# Patient Record
Sex: Female | Born: 1950 | Race: White | Hispanic: No | Marital: Married | Smoking: Never smoker
Health system: Southern US, Community
[De-identification: ages and names within clinical notes are randomized; demographics above are authoritative.]

## PROBLEM LIST (undated history)

## (undated) ENCOUNTER — Emergency Department (HOSPITAL_COMMUNITY): Discharge status: Absent without leave | Payer: Self-pay

## (undated) DIAGNOSIS — E669 Obesity, unspecified: Secondary | ICD-10-CM

## (undated) DIAGNOSIS — I10 Essential (primary) hypertension: Secondary | ICD-10-CM

## (undated) DIAGNOSIS — K5792 Diverticulitis of intestine, part unspecified, without perforation or abscess without bleeding: Secondary | ICD-10-CM

## (undated) DIAGNOSIS — K219 Gastro-esophageal reflux disease without esophagitis: Secondary | ICD-10-CM

## (undated) DIAGNOSIS — M791 Myalgia, unspecified site: Secondary | ICD-10-CM

## (undated) DIAGNOSIS — F419 Anxiety disorder, unspecified: Secondary | ICD-10-CM

## (undated) DIAGNOSIS — E785 Hyperlipidemia, unspecified: Secondary | ICD-10-CM

## (undated) DIAGNOSIS — N889 Noninflammatory disorder of cervix uteri, unspecified: Secondary | ICD-10-CM

## (undated) DIAGNOSIS — M8580 Other specified disorders of bone density and structure, unspecified site: Secondary | ICD-10-CM

## (undated) DIAGNOSIS — R102 Pelvic and perineal pain: Secondary | ICD-10-CM

## (undated) DIAGNOSIS — I4891 Unspecified atrial fibrillation: Secondary | ICD-10-CM

## (undated) DIAGNOSIS — L570 Actinic keratosis: Secondary | ICD-10-CM

## (undated) HISTORY — DX: Gastro-esophageal reflux disease without esophagitis: K21.9

## (undated) HISTORY — DX: Diverticulitis of intestine, part unspecified, without perforation or abscess without bleeding: K57.92

## (undated) HISTORY — DX: Actinic keratosis: L57.0

## (undated) HISTORY — DX: Essential (primary) hypertension: I10

## (undated) HISTORY — DX: Noninflammatory disorder of cervix uteri, unspecified: N88.9

## (undated) HISTORY — PX: COLON SURGERY: SHX602

## (undated) HISTORY — DX: Pelvic and perineal pain: R10.2

---

## 2004-10-06 ENCOUNTER — Ambulatory Visit: Payer: Self-pay

## 2005-11-30 ENCOUNTER — Ambulatory Visit: Payer: Self-pay

## 2007-02-06 ENCOUNTER — Ambulatory Visit: Payer: Self-pay

## 2007-02-15 ENCOUNTER — Ambulatory Visit: Payer: Self-pay

## 2007-12-25 ENCOUNTER — Other Ambulatory Visit: Payer: Self-pay

## 2007-12-25 ENCOUNTER — Emergency Department: Payer: Self-pay | Admitting: Emergency Medicine

## 2008-02-19 ENCOUNTER — Ambulatory Visit: Payer: Self-pay

## 2009-03-11 ENCOUNTER — Ambulatory Visit: Payer: Self-pay | Admitting: Family Medicine

## 2009-07-20 ENCOUNTER — Inpatient Hospital Stay: Payer: Self-pay | Admitting: Surgery

## 2009-09-04 ENCOUNTER — Inpatient Hospital Stay: Payer: Self-pay | Admitting: Surgery

## 2009-09-16 HISTORY — PX: LAPAROSCOPIC SIGMOID COLECTOMY: SHX5928

## 2009-09-23 ENCOUNTER — Ambulatory Visit: Payer: Self-pay | Admitting: Surgery

## 2009-09-30 ENCOUNTER — Inpatient Hospital Stay: Payer: Self-pay | Admitting: Surgery

## 2010-03-24 ENCOUNTER — Ambulatory Visit: Payer: Self-pay | Admitting: Family Medicine

## 2011-04-21 ENCOUNTER — Ambulatory Visit: Payer: Self-pay | Admitting: Family Medicine

## 2011-10-31 ENCOUNTER — Emergency Department: Payer: Self-pay | Admitting: *Deleted

## 2012-04-24 ENCOUNTER — Ambulatory Visit: Payer: Self-pay | Admitting: Family Medicine

## 2013-04-25 ENCOUNTER — Ambulatory Visit: Payer: Self-pay | Admitting: Family Medicine

## 2014-06-04 ENCOUNTER — Ambulatory Visit: Payer: Self-pay | Admitting: Family Medicine

## 2015-07-10 ENCOUNTER — Other Ambulatory Visit: Payer: Self-pay | Admitting: Family Medicine

## 2015-07-10 DIAGNOSIS — Z139 Encounter for screening, unspecified: Secondary | ICD-10-CM

## 2015-08-11 ENCOUNTER — Ambulatory Visit
Admission: RE | Admit: 2015-08-11 | Discharge: 2015-08-11 | Disposition: A | Discharge status: Home / Self Care | Payer: BC Managed Care – PPO | Source: Ambulatory Visit | Attending: Family Medicine | Admitting: Family Medicine

## 2015-08-11 DIAGNOSIS — Z1231 Encounter for screening mammogram for malignant neoplasm of breast: Secondary | ICD-10-CM | POA: Diagnosis present

## 2015-08-11 DIAGNOSIS — Z139 Encounter for screening, unspecified: Secondary | ICD-10-CM

## 2015-09-12 DIAGNOSIS — N889 Noninflammatory disorder of cervix uteri, unspecified: Secondary | ICD-10-CM | POA: Insufficient documentation

## 2015-09-12 HISTORY — DX: Noninflammatory disorder of cervix uteri, unspecified: N88.9

## 2016-02-14 ENCOUNTER — Emergency Department
Admission: EM | Admit: 2016-02-14 | Discharge: 2016-02-14 | Disposition: A | Discharge status: Home / Self Care | Payer: BC Managed Care – PPO | Attending: Emergency Medicine | Admitting: Emergency Medicine

## 2016-02-14 ENCOUNTER — Encounter: Payer: Self-pay | Admitting: Emergency Medicine

## 2016-02-14 DIAGNOSIS — I10 Essential (primary) hypertension: Secondary | ICD-10-CM | POA: Diagnosis not present

## 2016-02-14 DIAGNOSIS — T887XXA Unspecified adverse effect of drug or medicament, initial encounter: Secondary | ICD-10-CM | POA: Insufficient documentation

## 2016-02-14 DIAGNOSIS — Y829 Unspecified medical devices associated with adverse incidents: Secondary | ICD-10-CM | POA: Insufficient documentation

## 2016-02-14 DIAGNOSIS — T7840XA Allergy, unspecified, initial encounter: Secondary | ICD-10-CM | POA: Diagnosis present

## 2016-02-14 DIAGNOSIS — T464X5A Adverse effect of angiotensin-converting-enzyme inhibitors, initial encounter: Secondary | ICD-10-CM | POA: Diagnosis not present

## 2016-02-14 HISTORY — DX: Gastro-esophageal reflux disease without esophagitis: K21.9

## 2016-02-14 HISTORY — DX: Essential (primary) hypertension: I10

## 2016-02-14 MED ORDER — METHYLPREDNISOLONE SODIUM SUCC 125 MG IJ SOLR
125.0000 mg | Freq: Once | INTRAMUSCULAR | Status: AC
  Administered 2016-02-14: 125 mg via INTRAMUSCULAR
  Filled 2016-02-14: qty 2

## 2016-02-14 MED ORDER — PREDNISONE 10 MG PO TABS: 10.0000 mg | ORAL_TABLET | ORAL | 0 refills | Status: DC

## 2016-02-14 NOTE — ED Notes (Signed)
Patient states intermit numbness around mouth. Denies neuro symptoms. Family states notable increasing in swelling around mouth. Airway clear, NAD noted

## 2016-02-14 NOTE — ED Triage Notes (Signed)
States was seen for possible allergic rxt while out of town. Was seen and given some shots but no rx. Was to take benadryl otc. States still taking her enalapril b/c the ed dr wasn't sure if it was a rxt to the medication. States her face feels numb on and off when she takes the medication. No swelling noted.

## 2016-02-14 NOTE — ED Provider Notes (Signed)
[facility] Emergency Department Provider Note  ____________________________________________  Time seen: Approximately 3:00 PM  I have reviewed the triage vital signs and the nursing notes.   HISTORY  Chief Complaint Numbness (feels numb but can feel touch) and Allergic Reaction    HPI Rebecca Burton is a 65 y.o. female who presents emergency department complaining of cheek, lip, tongue swelling. Patient states that she had a "allergic reaction" and was seen at an urgent care and was given shot of steroid and Benadryl. She reports that provider was concerned that this may be in part due to her ACE inhibitor. Patient states that she stopped her blood pressure medication for 2 days. She began to experience a headache and restarted her enalapril. Patient reports that today she had some upper and lower lip swelling, feeling like her tongue was patent headache, and cheek swelling. Patient denies any shortness of breath, wheezing, difficulty breathing. Patient states that symptoms have gone down after taking Benadryl. No other complaint of headache, fevers or chills, difficulty breathing or swallowing, chest pain, abdominal pain, nausea or vomiting.   Past Medical History:  Diagnosis Date  . Acid reflux   . Hypertension     There are no active problems to display for this patient.   Past Surgical History:  Procedure Laterality Date  . COLON SURGERY      Prior to Admission medications   Medication Sig Start Date End Date Taking? Authorizing Provider  enalapril (VASOTEC) 10 MG tablet Take 10 mg by mouth daily.   Yes Historical Provider, MD  hydrochlorothiazide (HYDRODIURIL) 25 MG tablet Take 25 mg by mouth daily.   Yes Historical Provider, MD  predniSONE (DELTASONE) 10 MG tablet Take 1 tablet (10 mg total) by mouth as directed. 02/14/16   Charline Bills Eulogio Requena, PA-C    Allergies Review of patient's allergies indicates no known allergies.  Family History   Problem Relation Age of Onset  . Breast cancer Maternal Grandmother     Social History Social History  Substance Use Topics  . Smoking status: Never Smoker  . Smokeless tobacco: Never Used  . Alcohol use No     Review of Systems  Constitutional: No fever/chills Eyes: No visual changes. ENT: Positive for lip swelling. Positive for "thick tongue. Cardiovascular: no chest pain. Respiratory: no cough. No SOB. Gastrointestinal: No abdominal pain.  No nausea, no vomiting.   Musculoskeletal: Negative for musculoskeletal pain. Skin: Negative for rash, abrasions, lacerations, ecchymosis. Neurological: Negative for headaches, focal weakness or numbness. 10-point ROS otherwise negative.  ____________________________________________   PHYSICAL EXAM:  VITAL SIGNS: ED Triage Vitals [02/14/16 1330]  Enc Vitals Group     BP (!) 161/83     Pulse Rate 93     Resp 18     Temp 98.2 F (36.8 C)     Temp src      SpO2 96 %     Weight 235 lb (106.6 kg)     Height 5\' 5"  (1.651 m)     Head Circumference      Peak Flow      Pain Score      Pain Loc      Pain Edu?      Excl. in [place]?      Constitutional: Alert and oriented. Well appearing and in no acute distress. Eyes: Conjunctivae are normal. PERRL. EOMI. Head: Atraumatic. ENT:      Ears:       Nose: No congestion/rhinnorhea.  Mouth/Throat: Mucous membranes are moist. Lips are not edematous or erythematous. No oropharyngeal edema is identified. Uvula is midline. Neck: No stridor.    Cardiovascular: Normal rate, regular rhythm. Normal S1 and S2.  Good peripheral circulation. Respiratory: Normal respiratory effort without tachypnea or retractions. Lungs CTAB. Good air entry to the bases with no decreased or absent breath sounds. Musculoskeletal: Full range of motion to all extremities. No gross deformities appreciated. Neurologic:  Normal speech and language. No gross focal neurologic deficits are appreciated.  Skin:  Skin is  warm, dry and intact. No rash noted. Psychiatric: Mood and affect are normal. Speech and behavior are normal. Patient exhibits appropriate insight and judgement.   ____________________________________________   LABS (all labs ordered are listed, but only abnormal results are displayed)  Labs Reviewed - No data to display ____________________________________________  EKG   ____________________________________________  RADIOLOGY   No results found.  ____________________________________________    PROCEDURES  Procedure(s) performed:    Procedures    Medications  methylPREDNISolone sodium succinate (SOLU-MEDROL) 125 mg/2 mL injection 125 mg (not administered)     ____________________________________________   INITIAL IMPRESSION / ASSESSMENT AND PLAN / ED COURSE  Pertinent labs & imaging results that were available during my care of the patient were reviewed by me and considered in my medical decision making (see chart for details).  Clinical Course    Patient's diagnosis is consistent with Adverse reaction to ACE inhibitor. Patient reports swelling that has improved after taking Benadryl at home. Exam is reassuring with no oral or oropharyngeal edema. No stridor. No respiratory distress. Patient speaking in full sentences. She is given injection of steroid here in the emergency department and will be discharged home with oral steroids and instructions to continue Benadryl. Patient is advised not to restart her ACE inhibitor at this time. Patient is to take double her normal dose of HCTZ for blood pressure control until she can talk with her primary care provider on Monday morning. Patient is given strict ED precautions to return to the emergency department for any worsening of her symptoms..     ____________________________________________  FINAL CLINICAL IMPRESSION(S) / ED DIAGNOSES  Final diagnoses:  Adverse reaction to ACE inhibitor drug, initial encounter       NEW MEDICATIONS STARTED DURING THIS VISIT:  New Prescriptions   PREDNISONE (DELTASONE) 10 MG TABLET    Take 1 tablet (10 mg total) by mouth as directed.        This chart was dictated using voice recognition software/Dragon. Despite best efforts to proofread, errors can occur which can change the meaning. Any change was purely unintentional.    Darletta Moll, PA-C 02/14/16 1609    Lisa Roca, MD 02/17/16 [phone]

## 2016-02-14 NOTE — ED Notes (Signed)

## 2016-07-28 ENCOUNTER — Other Ambulatory Visit: Payer: Self-pay | Admitting: Family Medicine

## 2016-07-28 DIAGNOSIS — Z1231 Encounter for screening mammogram for malignant neoplasm of breast: Secondary | ICD-10-CM

## 2016-09-06 ENCOUNTER — Ambulatory Visit
Admission: RE | Admit: 2016-09-06 | Discharge: 2016-09-06 | Disposition: A | Discharge status: Home / Self Care | Payer: Medicare Other | Source: Ambulatory Visit | Attending: Family Medicine | Admitting: Family Medicine

## 2016-09-06 DIAGNOSIS — Z1231 Encounter for screening mammogram for malignant neoplasm of breast: Secondary | ICD-10-CM | POA: Diagnosis present

## 2016-11-01 ENCOUNTER — Other Ambulatory Visit: Payer: Self-pay

## 2016-11-01 DIAGNOSIS — K5792 Diverticulitis of intestine, part unspecified, without perforation or abscess without bleeding: Secondary | ICD-10-CM

## 2016-11-01 DIAGNOSIS — I10 Essential (primary) hypertension: Secondary | ICD-10-CM

## 2016-11-01 DIAGNOSIS — R102 Pelvic and perineal pain: Secondary | ICD-10-CM

## 2016-11-01 DIAGNOSIS — E669 Obesity, unspecified: Secondary | ICD-10-CM | POA: Insufficient documentation

## 2016-11-01 DIAGNOSIS — K219 Gastro-esophageal reflux disease without esophagitis: Secondary | ICD-10-CM | POA: Insufficient documentation

## 2016-11-01 HISTORY — DX: Diverticulitis of intestine, part unspecified, without perforation or abscess without bleeding: K57.92

## 2016-11-01 HISTORY — DX: Gastro-esophageal reflux disease without esophagitis: K21.9

## 2016-11-01 HISTORY — DX: Essential (primary) hypertension: I10

## 2016-11-01 HISTORY — DX: Pelvic and perineal pain: R10.2

## 2016-11-02 ENCOUNTER — Ambulatory Visit: Payer: Medicare Other | Admitting: Surgery

## 2016-11-08 ENCOUNTER — Ambulatory Visit (INDEPENDENT_AMBULATORY_CARE_PROVIDER_SITE_OTHER): Payer: Medicare Other | Admitting: General Surgery

## 2016-11-08 ENCOUNTER — Encounter: Payer: Self-pay | Admitting: General Surgery

## 2016-11-08 VITALS — BP 179/84 | HR 90 | Temp 98.0°F | Ht 65.0 in | Wt 233.0 lb

## 2016-11-08 DIAGNOSIS — R1010 Upper abdominal pain, unspecified: Secondary | ICD-10-CM | POA: Diagnosis not present

## 2016-11-08 NOTE — Progress Notes (Signed)
Patient ID: Rebecca Burton, female   DOB: 10/12/1950, 66 y.o.   MRN: [id number]  CC: ABDOMINAL PAIN  HPI Rebecca Burton is a 66 y.o. female who presents to clinic today for evaluation of abdominal pain. Patient reports a history of intermittent pains primarily in the left and right flank. She thinks they're associated with eating. The pain starts in her flanks and then will go upper mid back to her bilateral shoulder blades. She was evaluated by cardiology which then turned evaluated her gallbladder where she was found to have gallstones. Patient reports a history of 2 times in the last 6 weeks where she had pain after eating that this lasted for 24-36 hours. She describes the pain is sometimes being intense, stabbing. She also has occasional midepigastric pain which she associates with reflux. She is on reflux medication for this. She denies any fevers, chills, chest pain, shortness of breath. She's had loose stools that she associates with her antireflux medication. She has had some subjective nausea but no vomiting when she has the pain attacks.  HPI  Past Medical History:  Diagnosis Date  . Acid reflux   . Cervical lesion 09/12/2015  . Diverticulitis 11/01/2016  . GERD (gastroesophageal reflux disease) 11/01/2016  . HTN (hypertension) 11/01/2016  . Hypertension   . Pelvic pain in female 11/01/2016    Past Surgical History:  Procedure Laterality Date  . COLON SURGERY    . LAPAROSCOPIC SIGMOID COLECTOMY  09/2009    Family History  Problem Relation Age of Onset  . Breast cancer Maternal Grandmother   . Heart disease Mother   . Hypertension Mother   . Thyroid cancer Mother     Social History Social History  Substance Use Topics  . Smoking status: Never Smoker  . Smokeless tobacco: Never Used  . Alcohol use No    Allergies  Allergen Reactions  . Enalapril Swelling    Critical  . Macrolides And Ketolides Nausea And Vomiting    Yeast infection  . Naproxen Sodium Nausea And  Vomiting    Pt does not think she has a problem with this medication    Current Outpatient Prescriptions  Medication Sig Dispense Refill  . Calcium Carbonate-Vitamin D 600-400 MG-UNIT tablet Take 1 tablet by mouth 2 (two) times daily.    . fexofenadine (ALLEGRA) 180 MG tablet Take 1 tablet by mouth 1 day or 1 dose.    . fluticasone (FLONASE) 50 MCG/ACT nasal spray Place 2 sprays into the nose 1 day or 1 dose.    . hydrochlorothiazide (HYDRODIURIL) 25 MG tablet Take 25 mg by mouth daily.    Marland Kitchen ibuprofen (ADVIL,MOTRIN) 800 MG tablet Take 1 tablet by mouth every 8 (eight) hours as needed.    Marland Kitchen losartan (COZAAR) 100 MG tablet Take 1 tablet by mouth 1 day or 1 dose.    . Omega-3 Fatty Acids (PA FISH OIL) 1000 MG CAPS Take 1 tablet by mouth 1 day or 1 dose.    Marland Kitchen omeprazole (PRILOSEC) 40 MG capsule Take 1 capsule by mouth 1 day or 1 dose.     No current facility-administered medications for this visit.      Review of Systems A Multi-point review of systems was asked and was negative except for the findings documented in the history of present illness  Physical Exam Blood pressure (!) 179/84, pulse 90, temperature 98 F (36.7 C), temperature source Oral, height 5\' 5"  (1.651 m), weight 105.7 kg (233 lb). CONSTITUTIONAL: No acute distress.  EYES: Pupils are equal, round, and reactive to light, Sclera are non-icteric. EARS, NOSE, MOUTH AND THROAT: The oropharynx is clear. The oral mucosa is pink and moist. Hearing is intact to voice. LYMPH NODES:  Lymph nodes in the neck are normal. RESPIRATORY:  Lungs are clear. There is normal respiratory effort, with equal breath sounds bilaterally, and without pathologic use of accessory muscles. CARDIOVASCULAR: Heart is regular without murmurs, gallops, or rubs. GI: The abdomen is large, soft, nontender, and nondistended. There are no palpable masses. Well-healed midline incision from prior abdominal surgery. There is no hepatosplenomegaly. There are normal  bowel sounds in all quadrants. GU: Rectal deferred.   MUSCULOSKELETAL: Normal muscle strength and tone. No cyanosis or edema.   SKIN: Turgor is good and there are no pathologic skin lesions or ulcers. NEUROLOGIC: Motor and sensation is grossly normal. Cranial nerves are grossly intact. PSYCH:  Oriented to person, place and time. Affect is normal.  Data Reviewed Ultrasound report reviewed from care of your. It shows a distended gallbladder with evidence of gallstones and sludge. No evidence of cholecystitis. I have personally reviewed the patient's imaging, laboratory findings and medical records.    Assessment    Atypical abdominal pain    Plan    66 year old female with atypical abdominal pain here today for evaluation of cholelithiasis. After long conversation patient voiced that she really prefer not to have surgery. Her story and symptoms are not entirely consistent with cholelithiasis being the cause. Given this I could not guarantee to her that removing her gallbladder would relieve her symptoms. In this setting discussed possible referral to gastroenterology for further workup to include possible endoscopies. She voiced understanding and is willing to accept this plan. Discussed that if gastrology was unable to find any other possible causes that I will again offer her cholecystectomy. She voiced understanding and will follow-up in clinic in 5 weeks after being seen by GI.     Time spent with the patient was 45 minutes, with more than 50% of the time spent in face-to-face education, counseling and care coordination.     Clayburn Pert, MD FACS General Surgeon 11/08/2016, 3:30 PM

## 2016-11-08 NOTE — Patient Instructions (Signed)
WE would like for you to see the Gastroenterologist. We will send the referral and someone from their office will call and make an appointment. Please call our office if you have any questions. Please see your appointment listed below.

## 2016-11-09 ENCOUNTER — Telehealth: Payer: Self-pay

## 2016-11-09 NOTE — Telephone Encounter (Signed)
I have put in an internal referral to [facility]    I will follow up within 3-5 days to make sure the appointments have been scheduled.

## 2016-11-16 NOTE — Telephone Encounter (Signed)
An appointment has been scheduled with Dr. Allen Norris for 11/22/16

## 2016-11-17 ENCOUNTER — Telehealth: Payer: Self-pay

## 2016-11-17 NOTE — Telephone Encounter (Signed)
Patient notified of appointment with DR.Wohl on 11/22/16 @ 1:15 pm.  Also mailed reminedr appointment for DR.Woodham 12/03/16 @ 9:30 am.  Patient verbalized understanding.

## 2016-11-22 ENCOUNTER — Encounter: Payer: Self-pay | Admitting: Gastroenterology

## 2016-11-22 ENCOUNTER — Other Ambulatory Visit: Payer: Self-pay

## 2016-11-22 ENCOUNTER — Ambulatory Visit (INDEPENDENT_AMBULATORY_CARE_PROVIDER_SITE_OTHER): Payer: Medicare Other | Admitting: Gastroenterology

## 2016-11-22 VITALS — BP 155/80 | HR 70 | Temp 99.0°F | Ht 65.0 in | Wt 232.5 lb

## 2016-11-22 DIAGNOSIS — R1013 Epigastric pain: Secondary | ICD-10-CM

## 2016-11-22 DIAGNOSIS — R112 Nausea with vomiting, unspecified: Secondary | ICD-10-CM

## 2016-11-22 DIAGNOSIS — G8929 Other chronic pain: Secondary | ICD-10-CM | POA: Diagnosis not present

## 2016-11-22 NOTE — Progress Notes (Signed)
Gastroenterology Consultation  Referring Provider:     Clayburn Pert, MD Primary Care Physician:  Patient, No Pcp Per Primary Gastroenterologist:  Dr. Allen Norris     Reason for Consultation:     Epigastric pain        HPI:   Rebecca Burton is a 66 y.o. y/o female referred for consultation & management of Epigastric pain by Dr. Patient, No Pcp Per.  This patient comes in today with a history of epigastric pain that wrapped around both sides and goes into her back. The patient states that sometimes it is associated with certain foods and other times the same food still cause her to have any problems. The patient reports that she has not had any unexplained weight loss and she feels that these episodes are getting more frequent. The patient had an episode around [place] time and then had one 3 weeks ago and then one last week. There is no report of any black stools or bloody stools she also denies any change in bowel habits. The patient did have a colonoscopy 10 years ago and she reports one year after that she had a pinhole perforation in her colon which needed surgery. She is convinced that the colonoscopy 1 year prior may have caused her to have the whole in her colon. The patient was reporting to me that she was told that she would need a repeat colonoscopy in 5-7 years from the last one. She is not sure if she had any polyps at that time but denies any family history of colon cancer colon polyps. Patient reports that she has heartburn with burning in her throat and chest. She is on omeprazole and states that she still has the heartburn. She also states that she had an upper endoscopy approximate years ago at the time of colonoscopy and was told that there was scarring in her esophagus.  Past Medical History:  Diagnosis Date  . Acid reflux   . Cervical lesion 09/12/2015  . Diverticulitis 11/01/2016  . GERD (gastroesophageal reflux disease) 11/01/2016  . HTN (hypertension) 11/01/2016  . Hypertension     . Pelvic pain in female 11/01/2016    Past Surgical History:  Procedure Laterality Date  . COLON SURGERY    . LAPAROSCOPIC SIGMOID COLECTOMY  09/2009    Prior to Admission medications   Medication Sig Start Date End Date Taking? Authorizing Provider  Calcium Carbonate-Vitamin D 600-400 MG-UNIT tablet Take 1 tablet by mouth 2 (two) times daily.   Yes [provider]  fexofenadine (ALLEGRA) 180 MG tablet Take 1 tablet by mouth 1 day or 1 dose.   Yes [provider]  fluticasone (FLONASE) 50 MCG/ACT nasal spray Place 2 sprays into the nose 1 day or 1 dose.   Yes [provider]  hydrochlorothiazide (HYDRODIURIL) 25 MG tablet Take 25 mg by mouth daily.   Yes [provider]  ibuprofen (ADVIL,MOTRIN) 800 MG tablet Take 1 tablet by mouth every 8 (eight) hours as needed.   Yes [provider]  losartan (COZAAR) 100 MG tablet Take 1 tablet by mouth 1 day or 1 dose. 02/16/16  Yes [provider]  Omega-3 Fatty Acids (PA FISH OIL) 1000 MG CAPS Take 1 tablet by mouth 1 day or 1 dose.   Yes [provider]  omeprazole (PRILOSEC) 40 MG capsule Take 1 capsule by mouth 1 day or 1 dose. 04/12/16  Yes [provider]    Family History  Problem Relation Age  of Onset  . Breast cancer Maternal Grandmother   . Heart disease Mother   . Hypertension Mother   . Thyroid cancer Mother      Social History  Substance Use Topics  . Smoking status: Never Smoker  . Smokeless tobacco: Never Used  . Alcohol use No    Allergies as of 11/22/2016 - Review Complete 11/22/2016  Allergen Reaction Noted  . Enalapril Swelling 03/05/2016  . Macrolides and ketolides Nausea And Vomiting 09/12/2015  . Naproxen sodium Nausea And Vomiting 09/12/2015    Review of Systems:    All systems reviewed and negative except where noted in HPI.   Physical Exam:  BP (!) 155/80   Pulse 70   Temp 99 F (37.2 C) (Oral)   Ht 5\' 5"  (1.651 m)   Wt 232 lb 8 oz  (105.5 kg)   BMI 38.69 kg/m  No LMP recorded. Patient is postmenopausal. Psych:  Alert and cooperative. Normal mood and affect. General:   Alert,  Well-developed, well-nourished, pleasant and cooperative in NAD Head:  Normocephalic and atraumatic. Eyes:  Sclera clear, no icterus.   Conjunctiva pink. Ears:  Normal auditory acuity. Nose:  No deformity, discharge, or lesions. Mouth:  No deformity or lesions,oropharynx pink & moist. Neck:  Supple; no masses or thyromegaly. Lungs:  Respirations even and unlabored.  Clear throughout to auscultation.   No wheezes, crackles, or rhonchi. No acute distress. Heart:  Regular rate and rhythm; no murmurs, clicks, rubs, or gallops. Abdomen:  Normal bowel sounds.  No bruits.  Soft, non-tender and non-distended without masses, hepatosplenomegaly or hernias noted.  No guarding or rebound tenderness.  Negative Carnett sign.   Rectal:  Deferred.  Msk:  Symmetrical without gross deformities.  Good, equal movement & strength bilaterally. Pulses:  Normal pulses noted. Extremities:  No clubbing or edema.  No cyanosis. Neurologic:  Alert and oriented x3;  grossly normal neurologically. Skin:  Intact without significant lesions or rashes.  No jaundice. Lymph Nodes:  No significant cervical adenopathy. Psych:  Alert and cooperative. Normal mood and affect.  Imaging Studies: No results found.  Assessment and Plan:   Rebecca Burton is a 66 y.o. y/o female who comes in today with abdominal pain is not reproducible on physical exam today. The patient will be started on a trial of Dexilant to see if this helps her symptoms better than her omeprazole. The patient has also been told that she should have her upper endoscopy to look for source of her abdominal pain that is in the epigastric area and wraps around bilaterally to her back. The patient has been seen by surgery for her gallstone and they're considering surgery once a GI cause is ruled out. The patient will also  be set up for a colonoscopy because of her report that she was told to have a repeat in 5-7 years. I have discussed risks & benefits which include, but are not limited to, bleeding, infection, perforation & drug reaction.  The patient agrees with this plan & written consent will be obtained.       Lucilla Lame, MD. Marval Regal   Note: This dictation was prepared with Dragon dictation along with smaller phrase technology. Any transcriptional errors that result from this process are unintentional.

## 2016-11-23 ENCOUNTER — Telehealth: Payer: Self-pay | Admitting: Gastroenterology

## 2016-11-23 ENCOUNTER — Other Ambulatory Visit: Payer: Self-pay

## 2016-11-23 DIAGNOSIS — R112 Nausea with vomiting, unspecified: Secondary | ICD-10-CM

## 2016-11-23 DIAGNOSIS — R1013 Epigastric pain: Secondary | ICD-10-CM

## 2016-11-23 NOTE — Telephone Encounter (Signed)
11/23/16 [employer] website Prior Auth is NOT required for EGD 43235 / r10.13 & R11.2 Decision ID# [id number]

## 2016-11-25 ENCOUNTER — Ambulatory Visit: Payer: Medicare Other | Admitting: Anesthesiology

## 2016-11-25 ENCOUNTER — Ambulatory Visit
Admission: RE | Admit: 2016-11-25 | Discharge: 2016-11-25 | Disposition: A | Discharge status: Home / Self Care | Payer: Medicare Other | Source: Ambulatory Visit | Attending: Gastroenterology | Admitting: Gastroenterology

## 2016-11-25 ENCOUNTER — Encounter
Admission: RE | Disposition: A | Discharge status: Home / Self Care | Payer: Self-pay | Source: Ambulatory Visit | Attending: Gastroenterology

## 2016-11-25 DIAGNOSIS — K449 Diaphragmatic hernia without obstruction or gangrene: Secondary | ICD-10-CM | POA: Insufficient documentation

## 2016-11-25 DIAGNOSIS — K219 Gastro-esophageal reflux disease without esophagitis: Secondary | ICD-10-CM | POA: Diagnosis not present

## 2016-11-25 DIAGNOSIS — I10 Essential (primary) hypertension: Secondary | ICD-10-CM | POA: Insufficient documentation

## 2016-11-25 DIAGNOSIS — R1013 Epigastric pain: Secondary | ICD-10-CM | POA: Diagnosis not present

## 2016-11-25 DIAGNOSIS — Z980 Intestinal bypass and anastomosis status: Secondary | ICD-10-CM | POA: Insufficient documentation

## 2016-11-25 DIAGNOSIS — Z7951 Long term (current) use of inhaled steroids: Secondary | ICD-10-CM | POA: Insufficient documentation

## 2016-11-25 DIAGNOSIS — Z79899 Other long term (current) drug therapy: Secondary | ICD-10-CM | POA: Insufficient documentation

## 2016-11-25 DIAGNOSIS — K640 First degree hemorrhoids: Secondary | ICD-10-CM | POA: Insufficient documentation

## 2016-11-25 DIAGNOSIS — Z1211 Encounter for screening for malignant neoplasm of colon: Secondary | ICD-10-CM

## 2016-11-25 DIAGNOSIS — K5730 Diverticulosis of large intestine without perforation or abscess without bleeding: Secondary | ICD-10-CM | POA: Insufficient documentation

## 2016-11-25 DIAGNOSIS — K222 Esophageal obstruction: Secondary | ICD-10-CM | POA: Insufficient documentation

## 2016-11-25 HISTORY — PX: COLONOSCOPY WITH PROPOFOL: SHX5780

## 2016-11-25 HISTORY — PX: ESOPHAGEAL DILATION: SHX303

## 2016-11-25 HISTORY — PX: ESOPHAGOGASTRODUODENOSCOPY (EGD) WITH PROPOFOL: SHX5813

## 2016-11-25 SURGERY — COLONOSCOPY WITH PROPOFOL
Anesthesia: Monitor Anesthesia Care | Wound class: Contaminated

## 2016-11-25 MED ORDER — ONDANSETRON HCL 4 MG/2ML IJ SOLN: 4.0000 mg | Freq: Once | INTRAMUSCULAR | Status: DC | PRN

## 2016-11-25 MED ORDER — STERILE WATER FOR IRRIGATION IR SOLN
Status: DC | PRN
  Administered 2016-11-25: 09:00:00

## 2016-11-25 MED ORDER — GLYCOPYRROLATE 0.2 MG/ML IJ SOLN
INTRAMUSCULAR | Status: DC | PRN
  Administered 2016-11-25: 0.2 mg via INTRAVENOUS

## 2016-11-25 MED ORDER — LACTATED RINGERS IV SOLN
10.0000 mL/h | INTRAVENOUS | Status: DC
  Administered 2016-11-25: 10 mL/h via INTRAVENOUS

## 2016-11-25 MED ORDER — PROPOFOL 10 MG/ML IV BOLUS
INTRAVENOUS | Status: DC | PRN
  Administered 2016-11-25 (×2): 30 mg via INTRAVENOUS
  Administered 2016-11-25: 10 mg via INTRAVENOUS
  Administered 2016-11-25 (×4): 20 mg via INTRAVENOUS
  Administered 2016-11-25: 70 mg via INTRAVENOUS
  Administered 2016-11-25 (×4): 20 mg via INTRAVENOUS

## 2016-11-25 MED ORDER — LIDOCAINE HCL (CARDIAC) 20 MG/ML IV SOLN
INTRAVENOUS | Status: DC | PRN
  Administered 2016-11-25: 50 mg via INTRAVENOUS

## 2016-11-25 MED ORDER — ACETAMINOPHEN 325 MG PO TABS: 325.0000 mg | ORAL_TABLET | ORAL | Status: DC | PRN

## 2016-11-25 MED ORDER — ACETAMINOPHEN 160 MG/5ML PO SOLN
325.0000 mg | ORAL | Status: DC | PRN
Start: 2016-11-25 — End: 2016-11-25

## 2016-11-25 SURGICAL SUPPLY — 35 items
BALLN DILATOR 10-12 8 (BALLOONS)
BALLN DILATOR 12-15 8 (BALLOONS)
BALLN DILATOR 15-18 8 (BALLOONS) ×4
BALLN DILATOR CRE 0-12 8 (BALLOONS)
BALLN DILATOR ESOPH 8 10 CRE (MISCELLANEOUS) IMPLANT
BALLOON DILATOR 12-15 8 (BALLOONS) IMPLANT
BALLOON DILATOR 15-18 8 (BALLOONS) ×2 IMPLANT
BALLOON DILATOR CRE 0-12 8 (BALLOONS) IMPLANT
BLOCK BITE 60FR ADLT L/F GRN (MISCELLANEOUS) ×4 IMPLANT
CANISTER SUCT 1200ML W/VALVE (MISCELLANEOUS) ×4 IMPLANT
CLIP HMST 235XBRD CATH ROT (MISCELLANEOUS) IMPLANT
CLIP RESOLUTION 360 11X235 (MISCELLANEOUS)
FCP ESCP3.2XJMB 240X2.8X (MISCELLANEOUS)
FORCEPS BIOP RAD 4 LRG CAP 4 (CUTTING FORCEPS) IMPLANT
FORCEPS BIOP RJ4 240 W/NDL (MISCELLANEOUS)
FORCEPS ESCP3.2XJMB 240X2.8X (MISCELLANEOUS) IMPLANT
GOWN CVR UNV OPN BCK APRN NK (MISCELLANEOUS) ×4 IMPLANT
GOWN ISOL THUMB LOOP REG UNIV (MISCELLANEOUS) ×4
INJECTOR VARIJECT VIN23 (MISCELLANEOUS) IMPLANT
KIT DEFENDO VALVE AND CONN (KITS) IMPLANT
KIT ENDO PROCEDURE OLY (KITS) ×4 IMPLANT
MARKER SPOT ENDO TATTOO 5ML (MISCELLANEOUS) IMPLANT
PAD GROUND ADULT SPLIT (MISCELLANEOUS) IMPLANT
PROBE APC STR FIRE (PROBE) IMPLANT
RETRIEVER NET PLAT FOOD (MISCELLANEOUS) IMPLANT
RETRIEVER NET ROTH 2.5X230 LF (MISCELLANEOUS) IMPLANT
SNARE SHORT THROW 13M SML OVAL (MISCELLANEOUS) IMPLANT
SNARE SHORT THROW 30M LRG OVAL (MISCELLANEOUS) IMPLANT
SNARE SNG USE RND 15MM (INSTRUMENTS) IMPLANT
SPOT EX ENDOSCOPIC TATTOO (MISCELLANEOUS)
SYR INFLATION 60ML (SYRINGE) ×4 IMPLANT
TRAP ETRAP POLY (MISCELLANEOUS) IMPLANT
VARIJECT INJECTOR VIN23 (MISCELLANEOUS)
WATER STERILE IRR 250ML POUR (IV SOLUTION) ×4 IMPLANT
WIRE CRE 18-20MM 8CM F G (MISCELLANEOUS) IMPLANT

## 2016-11-25 NOTE — Anesthesia Procedure Notes (Signed)
Procedure Name: MAC Performed by: Mayme Genta Pre-anesthesia Checklist: Patient identified, Emergency Drugs available, Suction available, Timeout performed and Patient being monitored Patient Re-evaluated:Patient Re-evaluated prior to inductionOxygen Delivery Method: Nasal cannula Placement Confirmation: positive ETCO2

## 2016-11-25 NOTE — Op Note (Signed)
[facility] Gastroenterology Patient Name: Rebecca Burton Procedure Date: 11/25/2016 8:07 AM MRN: [id number] Account #: [id number] Date of Birth: 12/01/1950 Admit Type: Outpatient Age: 67 Room: [facility] OR ROOM 01 Gender: Female Note Status: Finalized Procedure:            Colonoscopy Indications:          Screening for colorectal malignant neoplasm Providers:            Lucilla Lame MD, MD Medicines:            Propofol per Anesthesia Complications:        No immediate complications. Procedure:            Pre-Anesthesia Assessment:                       - Prior to the procedure, a History and Physical was                        performed, and patient medications and allergies were                        reviewed. The patient's tolerance of previous                        anesthesia was also reviewed. The risks and benefits of                        the procedure and the sedation options and risks were                        discussed with the patient. All questions were                        answered, and informed consent was obtained. Prior                        Anticoagulants: The patient has taken no previous                        anticoagulant or antiplatelet agents. ASA Grade                        Assessment: II - A patient with mild systemic disease.                        After reviewing the risks and benefits, the patient was                        deemed in satisfactory condition to undergo the                        procedure.                       After obtaining informed consent, the colonoscope was                        passed under direct vision. Throughout the procedure,                        the patient's blood pressure,  pulse, and oxygen                        saturations were monitored continuously. The [place] (S#: [id number]) was introduced through                        the anus and advanced to the the  cecum, identified by                        appendiceal orifice and ileocecal valve. The                        colonoscopy was performed without difficulty. The                        patient tolerated the procedure well. The quality of                        the bowel preparation was excellent. Findings:      The perianal and digital rectal examinations were normal.      Multiple small-mouthed diverticula were found in the entire colon.      There was evidence of a prior end-to-end colo-colonic anastomosis in the       sigmoid colon. This was patent.      Non-bleeding internal hemorrhoids were found during retroflexion. The       hemorrhoids were Grade I (internal hemorrhoids that do not prolapse). Impression:           - Diverticulosis in the entire examined colon.                       - Patent end-to-end colo-colonic anastomosis.                       - Non-bleeding internal hemorrhoids.                       - No specimens collected. Recommendation:       - Discharge patient to home.                       - Resume previous diet.                       - Continue present medications.                       - Repeat colonoscopy in 10 years for screening unless                        any change in family history or lower GI problems. Procedure Code(s):    --- Professional ---                       [phone], Colonoscopy, flexible; diagnostic, including                        collection of specimen(s) by brushing or washing, when  performed (separate procedure) Diagnosis Code(s):    --- Professional ---                       Z12.11, Encounter for screening for malignant neoplasm                        of colon CPT copyright 2016 [employer]. All rights reserved. The codes documented in this report are preliminary and upon coder review may  be revised to meet current compliance requirements. Lucilla Lame MD, MD 11/25/2016 8:47:28 AM This report has  been signed electronically. Number of Addenda: 0 Note Initiated On: 11/25/2016 8:07 AM Scope Withdrawal Time: 0 hours 6 minutes 47 seconds  Total Procedure Duration: 0 hours 8 minutes 15 seconds       [facility]

## 2016-11-25 NOTE — Anesthesia Preprocedure Evaluation (Signed)
Anesthesia Evaluation  Patient identified by MRN, date of birth, ID band Patient awake    Reviewed: Allergy & Precautions, H&P , NPO status   Airway Mallampati: III  TM Distance: >3 FB     Dental  (+) Teeth Intact   Pulmonary    breath sounds clear to auscultation       Cardiovascular hypertension,  Rhythm:regular Rate:Normal     Neuro/Psych    GI/Hepatic GERD  ,Diverticulitis   Endo/Other    Renal/GU      Musculoskeletal   Abdominal   Peds  Hematology   Anesthesia Other Findings   Reproductive/Obstetrics                            Anesthesia Physical Anesthesia Plan  ASA: II  Anesthesia Plan: MAC   Post-op Pain Management:    Induction:   Airway Management Planned:   Additional Equipment:   Intra-op Plan:   Post-operative Plan:   Informed Consent: I have reviewed the patients History and Physical, chart, labs and discussed the procedure including the risks, benefits and alternatives for the proposed anesthesia with the patient or authorized representative who has indicated his/her understanding and acceptance.     Plan Discussed with: CRNA  Anesthesia Plan Comments:         Anesthesia Quick Evaluation

## 2016-11-25 NOTE — Anesthesia Postprocedure Evaluation (Signed)
Anesthesia Post Note  Patient: Rebecca Burton  Procedure(s) Performed: Procedure(s) (LRB): COLONOSCOPY WITH PROPOFOL (N/A) ESOPHAGOGASTRODUODENOSCOPY (EGD) WITH PROPOFOL (N/A) ESOPHAGEAL DILATION  Patient location during evaluation: PACU Anesthesia Type: MAC Level of consciousness: awake and alert Pain management: pain level controlled Vital Signs Assessment: post-procedure vital signs reviewed and stable Respiratory status: spontaneous breathing, nonlabored ventilation and respiratory function stable Cardiovascular status: stable Postop Assessment: no signs of nausea or vomiting Anesthetic complications: no    Veda Canning

## 2016-11-25 NOTE — Transfer of Care (Signed)
Immediate Anesthesia Transfer of Care Note  Patient: Rebecca Burton  Procedure(s) Performed: Procedure(s): COLONOSCOPY WITH PROPOFOL (N/A) ESOPHAGOGASTRODUODENOSCOPY (EGD) WITH PROPOFOL (N/A) ESOPHAGEAL DILATION  Patient Location: PACU  Anesthesia Type: MAC  Level of Consciousness: awake, alert  and patient cooperative  Airway and Oxygen Therapy: Patient Spontanous Breathing and Patient connected to supplemental oxygen  Post-op Assessment: Post-op Vital signs reviewed, Patient's Cardiovascular Status Stable, Respiratory Function Stable, Patent Airway and No signs of Nausea or vomiting  Post-op Vital Signs: Reviewed and stable  Complications: No apparent anesthesia complications

## 2016-11-25 NOTE — Op Note (Signed)
[facility] Gastroenterology Patient Name: Rebecca Burton Procedure Date: 11/25/2016 8:07 AM MRN: [id number] Account #: [id number] Date of Birth: [birth year] Admit Type: Outpatient Age: 66 Room: [facility] OR ROOM 01 Gender: Female Note Status: Finalized Procedure:            Upper GI endoscopy Indications:          Epigastric abdominal pain Providers:            Lucilla Lame MD, MD Referring MD:         Marguerita Merles, MD (Referring MD) Medicines:            Propofol per Anesthesia Complications:        No immediate complications. Procedure:            Pre-Anesthesia Assessment:                       - Prior to the procedure, a History and Physical was                        performed, and patient medications and allergies were                        reviewed. The patient's tolerance of previous                        anesthesia was also reviewed. The risks and benefits of                        the procedure and the sedation options and risks were                        discussed with the patient. All questions were                        answered, and informed consent was obtained. Prior                        Anticoagulants: The patient has taken no previous                        anticoagulant or antiplatelet agents. ASA Grade                        Assessment: II - A patient with mild systemic disease.                        After reviewing the risks and benefits, the patient was                        deemed in satisfactory condition to undergo the                        procedure.                       After obtaining informed consent, the endoscope was                        passed under direct vision. Throughout the procedure,  the patient's blood pressure, pulse, and oxygen                        saturations were monitored continuously. The Olympus                        GIF H180J Endoscope (Y#:[id number]) was introduced through          the mouth, and advanced to the second part of duodenum.                        The upper GI endoscopy was accomplished without                        difficulty. The patient tolerated the procedure well. Findings:      A small hiatal hernia was present.      One mild benign-appearing, intrinsic stenosis was found at the       gastroesophageal junction. And was traversed. A TTS dilator was passed       through the scope. Dilation with a 15-16.5-18 mm balloon dilator was       performed to 18 mm. The dilation site was examined following endoscope       reinsertion and showed complete resolution of luminal narrowing.      The stomach was normal.      The examined duodenum was normal. Impression:           - Small hiatal hernia.                       - Benign-appearing esophageal stenosis. Dilated.                       - Normal stomach.                       - Normal examined duodenum.                       - No specimens collected. Recommendation:       - Discharge patient to home.                       - Resume previous diet.                       - Continue present medications. Procedure Code(s):    --- Professional ---                       [phone], Esophagogastroduodenoscopy, flexible, transoral;                        with transendoscopic balloon dilation of esophagus                        (less than 30 mm diameter) Diagnosis Code(s):    --- Professional ---                       R10.13, Epigastric pain                       K22.2, Esophageal obstruction CPT copyright 2016 American Medical Association. All rights reserved. The codes documented in this  report are preliminary and upon coder review may  be revised to meet current compliance requirements. Lucilla Lame MD, MD 11/25/2016 8:36:39 AM This report has been signed electronically. Number of Addenda: 0 Note Initiated On: 11/25/2016 8:07 AM      [facility]

## 2016-11-25 NOTE — H&P (Signed)
Rebecca Lame, MD [address] [place], [place] [address] [phone] Fax : [phone]  Primary Care Physician:  Rebecca Merles, MD Primary Gastroenterologist:  Rebecca Burton  Pre-Procedure History & Physical: HPI:  Rebecca Burton is a 66 y.o. female is here for an endoscopy and colonoscopy.   Past Medical History:  Diagnosis Date  . Acid reflux   . Cervical lesion 09/12/2015  . Diverticulitis 11/01/2016  . GERD (gastroesophageal reflux disease) 11/01/2016  . HTN (hypertension) 11/01/2016  . Hypertension   . Pelvic pain in female 11/01/2016    Past Surgical History:  Procedure Laterality Date  . COLON SURGERY    . LAPAROSCOPIC SIGMOID COLECTOMY  09/2009    Prior to Admission medications   Medication Sig Start Date End Date Taking? Authorizing Provider  Calcium Carbonate-Vitamin D 600-400 MG-UNIT tablet Take 1 tablet by mouth 2 (two) times daily.   Yes [provider]  dexlansoprazole (DEXILANT) 60 MG capsule Take 60 mg by mouth daily.   Yes [provider]  fexofenadine (ALLEGRA) 180 MG tablet Take 1 tablet by mouth 1 day or 1 dose.   Yes [provider]  fluticasone (FLONASE) 50 MCG/ACT nasal spray Place 2 sprays into the nose 1 day or 1 dose.   Yes [provider]  hydrochlorothiazide (HYDRODIURIL) 25 MG tablet Take 25 mg by mouth daily.   Yes [provider]  ibuprofen (ADVIL,MOTRIN) 800 MG tablet Take 1 tablet by mouth every 8 (eight) hours as needed.   Yes [provider]  losartan (COZAAR) 100 MG tablet Take 1 tablet by mouth 1 day or 1 dose. 02/16/16  Yes [provider]  Omega-3 Fatty Acids (PA FISH OIL) 1000 MG CAPS Take 1 tablet by mouth 1 day or 1 dose.   Yes [provider]  omeprazole (PRILOSEC) 40 MG capsule Take 1 capsule by mouth 1 day or 1 dose. 04/12/16   [provider]    Allergies as of 11/23/2016 - Review Complete 11/23/2016  Allergen Reaction Noted  .  Enalapril Swelling 03/05/2016  . Macrolides and ketolides Nausea And Vomiting 09/12/2015  . Naproxen sodium Nausea And Vomiting 09/12/2015    Family History  Problem Relation Age of Onset  . Breast cancer Maternal Grandmother   . Heart disease Mother   . Hypertension Mother   . Thyroid cancer Mother     Social History   Social History  . Marital status: Married    Spouse name: N/A  . Number of children: N/A  . Years of education: N/A   Occupational History  . Not on file.   Social History Main Topics  . Smoking status: Never Smoker  . Smokeless tobacco: Never Used  . Alcohol use No  . Drug use: No  . Sexual activity: Not on file   Other Topics Concern  . Not on file   Social History Narrative  . No narrative on file    Review of Systems: See HPI, otherwise negative ROS  Physical Exam: BP (!) 154/89   Pulse 87   Temp 98.8 F (37.1 C) (Temporal)   Resp 16   Ht 5\' 5"  (1.651 m)   Wt 227 lb (103 kg)   SpO2 98%   BMI 37.77 kg/m  General:   Alert,  pleasant and cooperative in NAD Head:  Normocephalic and atraumatic. Neck:  Supple; no masses or thyromegaly. Lungs:  Clear throughout to auscultation.    Heart:  Regular rate and rhythm.  Abdomen:  Soft, nontender and nondistended. Normal bowel sounds, without guarding, and without rebound.   Neurologic:  Alert and  oriented x4;  grossly normal neurologically.  Impression/Plan: Rebecca Burton is here for an endoscopy and colonoscopy to be performed for epigastric pain and screening.  Risks, benefits, limitations, and alternatives regarding  endoscopy and colonoscopy have been reviewed with the patient.  Questions have been answered.  All parties agreeable.   Rebecca Lame, MD  11/25/2016, 8:06 AM

## 2016-11-26 ENCOUNTER — Encounter: Payer: Self-pay | Admitting: Gastroenterology

## 2016-12-03 ENCOUNTER — Telehealth: Payer: Self-pay | Admitting: General Surgery

## 2016-12-03 ENCOUNTER — Encounter: Payer: Self-pay | Admitting: General Surgery

## 2016-12-03 ENCOUNTER — Ambulatory Visit (INDEPENDENT_AMBULATORY_CARE_PROVIDER_SITE_OTHER): Payer: Medicare Other | Admitting: General Surgery

## 2016-12-03 VITALS — BP 159/92 | HR 81 | Temp 98.2°F | Ht 65.0 in | Wt 226.8 lb

## 2016-12-03 DIAGNOSIS — R1013 Epigastric pain: Secondary | ICD-10-CM | POA: Diagnosis not present

## 2016-12-03 NOTE — Progress Notes (Signed)
Outpatient Surgical Follow Up  12/03/2016  Rebecca Burton is an 66 y.o. female.   Chief Complaint  Patient presents with  . Follow-up    Abdominal pain    HPI: 66 year old female who is known to the surgery department returns to clinic for evaluation of upper abdominal pain. Patient has been evaluated for this before and was sent for GI evaluation. She has completed her GI workup and continues to have intermittent upper abdominal pain that starts on her breast bone and shoots around to her back bilaterally. It usually appears to be after eating. She then developed subjective nausea but no vomiting. It is happening every day and it does not matter what she eats. She denies any fevers, chills, vomiting, diarrhea, constipation. She is becoming very frustrated with the abdominal pain and desires to have it fixed if possible.  Past Medical History:  Diagnosis Date  . Acid reflux   . Cervical lesion 09/12/2015  . Diverticulitis 11/01/2016  . GERD (gastroesophageal reflux disease) 11/01/2016  . HTN (hypertension) 11/01/2016  . Hypertension   . Pelvic pain in female 11/01/2016    Past Surgical History:  Procedure Laterality Date  . COLON SURGERY    . COLONOSCOPY WITH PROPOFOL N/A 11/25/2016   Procedure: COLONOSCOPY WITH PROPOFOL;  Surgeon: Lucilla Lame, MD;  Location: [facility];  Service: Endoscopy;  Laterality: N/A;  . ESOPHAGEAL DILATION  11/25/2016   Procedure: ESOPHAGEAL DILATION;  Surgeon: Lucilla Lame, MD;  Location: [facility];  Service: Endoscopy;;  . ESOPHAGOGASTRODUODENOSCOPY (EGD) WITH PROPOFOL N/A 11/25/2016   Procedure: ESOPHAGOGASTRODUODENOSCOPY (EGD) WITH PROPOFOL;  Surgeon: Lucilla Lame, MD;  Location: [facility];  Service: Endoscopy;  Laterality: N/A;  . LAPAROSCOPIC SIGMOID COLECTOMY  09/2009    Family History  Problem Relation Age of Onset  . Breast cancer Maternal Grandmother   . Heart disease Mother   . Hypertension Mother   . Thyroid cancer  Mother     Social History:  reports that she has never smoked. She has never used smokeless tobacco. She reports that she does not drink alcohol or use drugs.  Allergies:  Allergies  Allergen Reactions  . Enalapril Swelling    Critical  . Macrolides And Ketolides Nausea And Vomiting    Yeast infection  . Naproxen Sodium Nausea And Vomiting    Pt does not think she has a problem with this medication    Medications reviewed.    ROS A multipoint review of systems was completed, all pertinent positives and negatives are documented within the history of present illness and remainder are negative   BP (!) 159/92   Pulse 81   Temp 98.2 F (36.8 C) (Oral)   Ht 5\' 5"  (1.651 m)   Wt 102.9 kg (226 lb 12.8 oz)   BMI 37.74 kg/m   Physical Exam Gen.: No acute distress Neck: Supple and nontender Chest: Clear to auscultation Heart: Regular rhythm Abdomen: Large, soft, nontender. Well-healed lower midline incision from prior colon surgery. Extremities: Moves all extremities well.    No results found for this or any previous visit (from the past 48 hour(s)). No results found.  Assessment/Plan:  1. Abdominal pain, epigastric 66 year old female with atypical abdominal pain and a known history of gallstones. I discussed the procedure of a laparoscopic cholecystectomy in detail. We discussed the risks and benefits of a laparoscopic cholecystectomy and possible cholangiogram including, but not limited to bleeding, infection, injury to surrounding structures such as the intestine or liver, bile leak, retained  gallstones, need to convert to an open procedure, prolonged diarrhea, blood clots such as  DVT, common bile duct injury, anesthesia risks, and possible need for additional procedures.  The likelihood of improvement in symptoms and return to the patient's normal status is good. We discussed the typical post-operative recovery course. Patient voiced understanding and desires to proceed as  soon as possible. Plan for surgery next Friday, May 25.  A total of 25 minutes was used on this encounter with greater than 50% of the time used for counseling her coordination of care.      Clayburn Pert, MD [facility] General Surgeon  12/03/2016,9:55 AM

## 2016-12-03 NOTE — Telephone Encounter (Signed)
Pt advised of pre op date/time and sx date. Sx: 12/10/16 with Dr Gabrielle Dare cholecystectomy.  Pre op: 12/07/16 @ 1:00pm--Office.   Patient made aware to call [phone], between 1-3:00pm the day before surgery, to find out what time to arrive.

## 2016-12-03 NOTE — Patient Instructions (Signed)
We have your surgery scheduled on 12/10/16 at [facility] with Dr.Woodham. Please see your [facility] pre-care sheet for surgery information. Please call our office if you have questions or concerns.

## 2016-12-07 ENCOUNTER — Encounter
Admission: RE | Admit: 2016-12-07 | Discharge: 2016-12-07 | Disposition: A | Discharge status: Home / Self Care | Payer: Medicare Other | Source: Ambulatory Visit | Attending: General Surgery | Admitting: General Surgery

## 2016-12-07 ENCOUNTER — Ambulatory Visit
Admission: RE | Admit: 2016-12-07 | Discharge: 2016-12-07 | Disposition: A | Discharge status: Home / Self Care | Payer: Medicare Other | Source: Ambulatory Visit | Attending: General Surgery | Admitting: General Surgery

## 2016-12-07 ENCOUNTER — Telehealth: Payer: Self-pay

## 2016-12-07 DIAGNOSIS — Z01812 Encounter for preprocedural laboratory examination: Secondary | ICD-10-CM | POA: Diagnosis present

## 2016-12-07 DIAGNOSIS — Z01810 Encounter for preprocedural cardiovascular examination: Secondary | ICD-10-CM

## 2016-12-07 LAB — CBC WITH DIFFERENTIAL/PLATELET
BASOS PCT: 1 %
Basophils Absolute: 0.1 10*3/uL (ref 0–0.1)
Eosinophils Absolute: 0.1 10*3/uL (ref 0–0.7)
Eosinophils Relative: 1 %
HEMATOCRIT: 39.1 % (ref 35.0–47.0)
HEMOGLOBIN: 13.5 g/dL (ref 12.0–16.0)
LYMPHS PCT: 32 %
Lymphs Abs: 2.7 10*3/uL (ref 1.0–3.6)
MCH: 30.9 pg (ref 26.0–34.0)
MCHC: 34.5 g/dL (ref 32.0–36.0)
MCV: 89.5 fL (ref 80.0–100.0)
Monocytes Absolute: 0.8 10*3/uL (ref 0.2–0.9)
Monocytes Relative: 10 %
NEUTROS ABS: 4.5 10*3/uL (ref 1.4–6.5)
NEUTROS PCT: 56 %
Platelets: 338 10*3/uL (ref 150–440)
RBC: 4.37 MIL/uL (ref 3.80–5.20)
RDW: 13.1 % (ref 11.5–14.5)
WBC: 8.2 10*3/uL (ref 3.6–11.0)

## 2016-12-07 LAB — COMPREHENSIVE METABOLIC PANEL
ALBUMIN: 4.1 g/dL (ref 3.5–5.0)
ALK PHOS: 97 U/L (ref 38–126)
ALT: 52 U/L (ref 14–54)
ANION GAP: 8 (ref 5–15)
AST: 46 U/L — AB (ref 15–41)
BILIRUBIN TOTAL: 0.5 mg/dL (ref 0.3–1.2)
BUN: 7 mg/dL (ref 6–20)
CALCIUM: 9.1 mg/dL (ref 8.9–10.3)
CO2: 26 mmol/L (ref 22–32)
Chloride: 103 mmol/L (ref 101–111)
Creatinine, Ser: 0.69 mg/dL (ref 0.44–1.00)
GFR calc Af Amer: >60 mL/min (ref >60)
Glucose, Bld: 97 mg/dL (ref 65–99)
POTASSIUM: 3.3 mmol/L — AB (ref 3.5–5.1)
Sodium: 137 mmol/L (ref 135–145)
TOTAL PROTEIN: 7.3 g/dL (ref 6.5–8.1)

## 2016-12-07 LAB — APTT: aPTT: 28 seconds (ref 24–36)

## 2016-12-07 LAB — PROTIME-INR
INR: 1.03
PROTHROMBIN TIME: 13.5 s (ref 11.4–15.2)

## 2016-12-07 NOTE — Telephone Encounter (Signed)
Dr. Adonis Huguenin prescribed the patient Mefoxin 1g IV piggy back every 6 hours. Sherry from anesthesia  is calling because she thinks this was meant to be a 1 time dosage. Please review this information and call Judeen Hammans back.

## 2016-12-07 NOTE — Telephone Encounter (Signed)
Orders have been modified to reflect Mefoxin 1g IVPB x Once Pre-operatively.

## 2016-12-07 NOTE — Patient Instructions (Signed)
Your procedure is scheduled on: 12/10/16 Report to Same Day Surgery 2nd floor medical mall [facility] Entrance-take elevator on left to 2nd floor.  Check in with surgery information desk.) To find out your arrival time please call [phone] between 1PM - 3PM on 12/09/16  Remember: Instructions that are not followed completely may result in serious medical risk, up to and including death, or upon the discretion of your surgeon and anesthesiologist your surgery may need to be rescheduled.    _x___ 1. Do not eat food or drink liquids after midnight. No gum chewing or                              hard candies.     __x__ 2. No Alcohol for 24 hours before or after surgery.   __x__3. No Smoking for 24 prior to surgery.   ____  4. Bring all medications with you on the day of surgery if instructed.    __x__ 5. Notify your doctor if there is any change in your medical condition     (cold, fever, infections).     Do not wear jewelry, make-up, hairpins, clips or nail polish.  Do not wear lotions, powders, or perfumes. You may wear deodorant.  Do not shave 48 hours prior to surgery. Men may shave face and neck.  Do not bring valuables to the hospital.    [facility] is not responsible for any belongings or valuables.               Contacts, dentures or bridgework may not be worn into surgery.  Leave your suitcase in the car. After surgery it may be brought to your room.  For patients admitted to the hospital, discharge time is determined by your                       treatment team.   Patients discharged the day of surgery will not be allowed to drive home.  You will need someone to drive you home and stay with you the night of your procedure.    Please read over the following fact sheets that you were given:   [facility] Preparing for Surgery and or MRSA Information   _x___ Take anti-hypertensive (unless it includes a diuretic), cardiac, seizure, asthma,     anti-reflux and psychiatric  medicines. These include:  1. DEXILANT  2. LOSARTAN  3.  4.  5.  6.  ____Fleets enema or Magnesium Citrate as directed.   _x___ Use CHG Soap or sage wipes as directed on instruction sheet   ____ Use inhalers on the day of surgery and bring to hospital day of surgery  ____ Stop Metformin and Janumet 2 days prior to surgery.    ____ Take 1/2 of usual insulin dose the night before surgery and none on the morning     surgery.   ____ Follow recommendations from Cardiologist, Pulmonologist or PCP regarding          stopping Aspirin, Coumadin, Pllavix ,Eliquis, Effient, or Pradaxa, and Pletal.  X____Stop Anti-inflammatories such as Advil, Aleve, Ibuprofen, Motrin, Naproxen, Naprosyn, Goodies powders or aspirin products. OK to take Tylenol and                         .   _x___ Stop supplements until after surgery.   STOP FISH OIL ____ Bring C-Pap to the  hospital.

## 2016-12-07 NOTE — Pre-Procedure Instructions (Signed)
Met C sent to Dr. Adonis Huguenin and Anesthesia for review.  EKG sent to Anesthesia for review.

## 2016-12-09 MED ORDER — DEXTROSE 5 % IV SOLN
1.0000 g | Freq: Once | INTRAVENOUS | Status: AC
  Administered 2016-12-10: 1 g via INTRAVENOUS
  Filled 2016-12-09: qty 1

## 2016-12-10 ENCOUNTER — Ambulatory Visit: Payer: Medicare Other | Admitting: Anesthesiology

## 2016-12-10 ENCOUNTER — Encounter: Payer: Self-pay | Admitting: Anesthesiology

## 2016-12-10 ENCOUNTER — Ambulatory Visit
Admission: RE | Admit: 2016-12-10 | Discharge: 2016-12-10 | Disposition: A | Discharge status: Home / Self Care | Payer: Medicare Other | Source: Ambulatory Visit | Attending: General Surgery | Admitting: General Surgery

## 2016-12-10 ENCOUNTER — Encounter
Admission: RE | Disposition: A | Discharge status: Home / Self Care | Payer: Self-pay | Source: Ambulatory Visit | Attending: General Surgery

## 2016-12-10 DIAGNOSIS — Z808 Family history of malignant neoplasm of other organs or systems: Secondary | ICD-10-CM | POA: Insufficient documentation

## 2016-12-10 DIAGNOSIS — K828 Other specified diseases of gallbladder: Secondary | ICD-10-CM | POA: Insufficient documentation

## 2016-12-10 DIAGNOSIS — Z888 Allergy status to other drugs, medicaments and biological substances status: Secondary | ICD-10-CM | POA: Diagnosis not present

## 2016-12-10 DIAGNOSIS — K219 Gastro-esophageal reflux disease without esophagitis: Secondary | ICD-10-CM | POA: Insufficient documentation

## 2016-12-10 DIAGNOSIS — I10 Essential (primary) hypertension: Secondary | ICD-10-CM | POA: Diagnosis not present

## 2016-12-10 DIAGNOSIS — Z803 Family history of malignant neoplasm of breast: Secondary | ICD-10-CM | POA: Diagnosis not present

## 2016-12-10 DIAGNOSIS — K811 Chronic cholecystitis: Secondary | ICD-10-CM

## 2016-12-10 DIAGNOSIS — K8010 Calculus of gallbladder with chronic cholecystitis without obstruction: Secondary | ICD-10-CM | POA: Diagnosis not present

## 2016-12-10 DIAGNOSIS — Z8249 Family history of ischemic heart disease and other diseases of the circulatory system: Secondary | ICD-10-CM | POA: Insufficient documentation

## 2016-12-10 HISTORY — PX: CHOLECYSTECTOMY: SHX55

## 2016-12-10 LAB — POCT I-STAT 4, (NA,K, GLUC, HGB,HCT)
GLUCOSE: 93 mg/dL (ref 65–99)
HEMATOCRIT: 40 % (ref 36.0–46.0)
Hemoglobin: 13.6 g/dL (ref 12.0–15.0)
Potassium: 3.4 mmol/L — ABNORMAL LOW (ref 3.5–5.1)
SODIUM: 141 mmol/L (ref 135–145)

## 2016-12-10 SURGERY — LAPAROSCOPIC CHOLECYSTECTOMY
Anesthesia: General | Wound class: Contaminated

## 2016-12-10 MED ORDER — ONDANSETRON HCL 4 MG/2ML IJ SOLN
INTRAMUSCULAR | Status: AC
  Administered 2016-12-10: 4 mg via INTRAVENOUS
  Filled 2016-12-10: qty 2

## 2016-12-10 MED ORDER — ACETAMINOPHEN 10 MG/ML IV SOLN
INTRAVENOUS | Status: AC
  Filled 2016-12-10: qty 100

## 2016-12-10 MED ORDER — LIDOCAINE HCL 1 % IJ SOLN
INTRAMUSCULAR | Status: DC | PRN
  Administered 2016-12-10: 25 mL

## 2016-12-10 MED ORDER — CHLORHEXIDINE GLUCONATE CLOTH 2 % EX PADS: 6.0000 | MEDICATED_PAD | Freq: Once | CUTANEOUS | Status: DC

## 2016-12-10 MED ORDER — MIDAZOLAM HCL 2 MG/2ML IJ SOLN
INTRAMUSCULAR | Status: AC
  Filled 2016-12-10: qty 2

## 2016-12-10 MED ORDER — SEVOFLURANE IN SOLN
RESPIRATORY_TRACT | Status: AC
  Filled 2016-12-10: qty 250

## 2016-12-10 MED ORDER — PHENYLEPHRINE HCL 10 MG/ML IJ SOLN
INTRAMUSCULAR | Status: DC | PRN
  Administered 2016-12-10 (×4): 100 ug via INTRAVENOUS

## 2016-12-10 MED ORDER — PROPOFOL 10 MG/ML IV BOLUS
INTRAVENOUS | Status: DC | PRN
  Administered 2016-12-10: 150 mg via INTRAVENOUS

## 2016-12-10 MED ORDER — BUPIVACAINE HCL (PF) 0.5 % IJ SOLN
INTRAMUSCULAR | Status: AC
  Filled 2016-12-10: qty 30

## 2016-12-10 MED ORDER — PROPOFOL 10 MG/ML IV BOLUS
INTRAVENOUS | Status: AC
  Filled 2016-12-10: qty 20

## 2016-12-10 MED ORDER — SUCCINYLCHOLINE CHLORIDE 20 MG/ML IJ SOLN
INTRAMUSCULAR | Status: AC
  Filled 2016-12-10: qty 1

## 2016-12-10 MED ORDER — ONDANSETRON HCL 4 MG/2ML IJ SOLN
4.0000 mg | Freq: Once | INTRAMUSCULAR | Status: AC | PRN
  Administered 2016-12-10: 4 mg via INTRAVENOUS

## 2016-12-10 MED ORDER — MIDAZOLAM HCL 2 MG/2ML IJ SOLN
INTRAMUSCULAR | Status: DC | PRN
  Administered 2016-12-10: 2 mg via INTRAVENOUS

## 2016-12-10 MED ORDER — ONDANSETRON HCL 4 MG/2ML IJ SOLN
INTRAMUSCULAR | Status: AC
  Filled 2016-12-10: qty 2

## 2016-12-10 MED ORDER — SUGAMMADEX SODIUM 200 MG/2ML IV SOLN
INTRAVENOUS | Status: DC | PRN
  Administered 2016-12-10: 200 mg via INTRAVENOUS

## 2016-12-10 MED ORDER — ROCURONIUM BROMIDE 100 MG/10ML IV SOLN
INTRAVENOUS | Status: DC | PRN
  Administered 2016-12-10: 10 mg via INTRAVENOUS

## 2016-12-10 MED ORDER — ACETAMINOPHEN 10 MG/ML IV SOLN
INTRAVENOUS | Status: DC | PRN
  Administered 2016-12-10: 1000 mg via INTRAVENOUS

## 2016-12-10 MED ORDER — LACTATED RINGERS IV SOLN
INTRAVENOUS | Status: DC
  Administered 2016-12-10 (×2): via INTRAVENOUS

## 2016-12-10 MED ORDER — FENTANYL CITRATE (PF) 250 MCG/5ML IJ SOLN
INTRAMUSCULAR | Status: AC
  Filled 2016-12-10: qty 5

## 2016-12-10 MED ORDER — FENTANYL CITRATE (PF) 100 MCG/2ML IJ SOLN
INTRAMUSCULAR | Status: AC
  Administered 2016-12-10: 25 ug via INTRAVENOUS
  Filled 2016-12-10: qty 2

## 2016-12-10 MED ORDER — HYDROCODONE-ACETAMINOPHEN 5-325 MG PO TABS: 1.0000 | ORAL_TABLET | ORAL | 0 refills | Status: DC | PRN

## 2016-12-10 MED ORDER — KETOROLAC TROMETHAMINE 30 MG/ML IJ SOLN
INTRAMUSCULAR | Status: AC
  Filled 2016-12-10: qty 1

## 2016-12-10 MED ORDER — FENTANYL CITRATE (PF) 100 MCG/2ML IJ SOLN
INTRAMUSCULAR | Status: DC | PRN
  Administered 2016-12-10: 50 ug via INTRAVENOUS
  Administered 2016-12-10: 100 ug via INTRAVENOUS
  Administered 2016-12-10 (×2): 50 ug via INTRAVENOUS

## 2016-12-10 MED ORDER — LIDOCAINE HCL (CARDIAC) 20 MG/ML IV SOLN
INTRAVENOUS | Status: DC | PRN
  Administered 2016-12-10: 100 mg via INTRAVENOUS

## 2016-12-10 MED ORDER — FENTANYL CITRATE (PF) 100 MCG/2ML IJ SOLN
25.0000 ug | INTRAMUSCULAR | Status: DC | PRN
  Administered 2016-12-10 (×4): 25 ug via INTRAVENOUS

## 2016-12-10 MED ORDER — DEXAMETHASONE SODIUM PHOSPHATE 10 MG/ML IJ SOLN
INTRAMUSCULAR | Status: AC
  Filled 2016-12-10: qty 1

## 2016-12-10 MED ORDER — LIDOCAINE HCL (PF) 1 % IJ SOLN
INTRAMUSCULAR | Status: AC
  Filled 2016-12-10: qty 30

## 2016-12-10 MED ORDER — SUGAMMADEX SODIUM 200 MG/2ML IV SOLN
INTRAVENOUS | Status: AC
  Filled 2016-12-10: qty 2

## 2016-12-10 MED ORDER — LIDOCAINE HCL (PF) 2 % IJ SOLN
INTRAMUSCULAR | Status: AC
  Filled 2016-12-10: qty 2

## 2016-12-10 MED ORDER — ONDANSETRON HCL 4 MG/2ML IJ SOLN
INTRAMUSCULAR | Status: DC | PRN
  Administered 2016-12-10: 4 mg via INTRAVENOUS

## 2016-12-10 MED ORDER — ROCURONIUM BROMIDE 50 MG/5ML IV SOLN
INTRAVENOUS | Status: AC
  Filled 2016-12-10: qty 1

## 2016-12-10 MED ORDER — DEXAMETHASONE SODIUM PHOSPHATE 10 MG/ML IJ SOLN
INTRAMUSCULAR | Status: DC | PRN
  Administered 2016-12-10: 10 mg via INTRAVENOUS

## 2016-12-10 MED ORDER — KETOROLAC TROMETHAMINE 30 MG/ML IJ SOLN
INTRAMUSCULAR | Status: DC | PRN
  Administered 2016-12-10: 30 mg via INTRAVENOUS

## 2016-12-10 SURGICAL SUPPLY — 43 items
ADHESIVE MASTISOL STRL (MISCELLANEOUS) ×3 IMPLANT
APPLIER CLIP ROT 10 11.4 M/L (STAPLE) ×3
BLADE SURG SZ11 CARB STEEL (BLADE) ×3 IMPLANT
CANISTER SUCT 1200ML W/VALVE (MISCELLANEOUS) ×3 IMPLANT
CATH CHOLANG 76X19 KUMAR (CATHETERS) IMPLANT
CHLORAPREP W/TINT 26ML (MISCELLANEOUS) ×3 IMPLANT
CLIP APPLIE ROT 10 11.4 M/L (STAPLE) ×1 IMPLANT
CLOSURE WOUND 1/2 X4 (GAUZE/BANDAGES/DRESSINGS)
CONRAY 60ML FOR OR (MISCELLANEOUS) IMPLANT
DECANTER SPIKE VIAL GLASS SM (MISCELLANEOUS) ×6 IMPLANT
DRAPE SHEET LG 3/4 BI-LAMINATE (DRAPES) ×3 IMPLANT
DRSG TEGADERM 2-3/8X2-3/4 SM (GAUZE/BANDAGES/DRESSINGS) ×12 IMPLANT
DRSG TELFA 4X3 1S NADH ST (GAUZE/BANDAGES/DRESSINGS) ×3 IMPLANT
ELECT REM PT RETURN 9FT ADLT (ELECTROSURGICAL) ×3
ELECTRODE REM PT RTRN 9FT ADLT (ELECTROSURGICAL) ×1 IMPLANT
GLOVE BIO SURGEON STRL SZ7.5 (GLOVE) ×3 IMPLANT
GLOVE INDICATOR 8.0 STRL GRN (GLOVE) ×3 IMPLANT
GOWN STRL REUS W/ TWL LRG LVL3 (GOWN DISPOSABLE) ×2 IMPLANT
GOWN STRL REUS W/TWL LRG LVL3 (GOWN DISPOSABLE) ×4
GRASPER SUT TROCAR 14GX15 (MISCELLANEOUS) IMPLANT
IRRIGATION STRYKERFLOW (MISCELLANEOUS) ×1 IMPLANT
IRRIGATOR STRYKERFLOW (MISCELLANEOUS) ×3
IV NS 1000ML (IV SOLUTION) ×2
IV NS 1000ML BAXH (IV SOLUTION) ×1 IMPLANT
L-HOOK LAP DISP 36CM (ELECTROSURGICAL) ×3
LABEL OR SOLS (LABEL) ×3 IMPLANT
LHOOK LAP DISP 36CM (ELECTROSURGICAL) ×1 IMPLANT
NEEDLE HYPO 25X1 1.5 SAFETY (NEEDLE) ×3 IMPLANT
NEEDLE VERESS 14GA 120MM (NEEDLE) ×3 IMPLANT
NS IRRIG 500ML POUR BTL (IV SOLUTION) ×3 IMPLANT
PACK LAP CHOLECYSTECTOMY (MISCELLANEOUS) ×3 IMPLANT
PENCIL ELECTRO HAND CTR (MISCELLANEOUS) ×3 IMPLANT
POUCH ENDO CATCH 10MM SPEC (MISCELLANEOUS) ×3 IMPLANT
SCISSORS METZENBAUM CVD 33 (INSTRUMENTS) ×3 IMPLANT
SLEEVE ENDOPATH XCEL 5M (ENDOMECHANICALS) ×6 IMPLANT
STRIP CLOSURE SKIN 1/2X4 (GAUZE/BANDAGES/DRESSINGS) IMPLANT
SUT MNCRL 4-0 (SUTURE) ×2
SUT MNCRL 4-0 27XMFL (SUTURE) ×1
SUT VICRYL 0 AB UR-6 (SUTURE) IMPLANT
SUTURE MNCRL 4-0 27XMF (SUTURE) ×1 IMPLANT
TROCAR XCEL 12X100 BLDLESS (ENDOMECHANICALS) ×3 IMPLANT
TROCAR XCEL NON-BLD 5MMX100MML (ENDOMECHANICALS) ×3 IMPLANT
TUBING INSUFFLATOR HI FLOW (MISCELLANEOUS) ×3 IMPLANT

## 2016-12-10 NOTE — Interval H&P Note (Signed)
History and Physical Interval Note:  12/10/2016 11:59 AM  Rebecca Burton  has presented today for surgery, with the diagnosis of ABDOMINAL PAIN EPIGASTRIC  The various methods of treatment have been discussed with the patient and family. After consideration of risks, benefits and other options for treatment, the patient has consented to  Procedure(s): LAPAROSCOPIC CHOLECYSTECTOMY (N/A) as a surgical intervention .  The patient's history has been reviewed, patient examined, no change in status, stable for surgery.  I have reviewed the patient's chart and labs.  Questions were answered to the patient's satisfaction.     Clayburn Pert

## 2016-12-10 NOTE — OR Nursing (Signed)
This RN is giving a 500 ml bolus for nausea.

## 2016-12-10 NOTE — Op Note (Signed)
Laparoscopic Cholecystectomy  Pre-operative Diagnosis: Abdominal pain  Post-operative Diagnosis: Chronic cholecystitis  Procedure: Laparoscopic cholecystectomy  Surgeon: Juanda Crumble T. Adonis Huguenin, MD FACS  Anesthesia: Gen. with endotracheal tube  Assistant: None  Procedure Details  The patient was seen again in the Holding Room. The benefits, complications, treatment options, and expected outcomes were discussed with the patient. The risks of bleeding, infection, recurrence of symptoms, failure to resolve symptoms, bile duct damage, bile duct leak, retained common bile duct stone, bowel injury, any of which could require further surgery and/or ERCP, stent, or papillotomy were reviewed with the patient. The likelihood of improving the patient's symptoms with return to their baseline status is good.  The patient and/or family concurred with the proposed plan, giving informed consent.  The patient was taken to Operating Room, identified as Rebecca Burton and the procedure verified as Laparoscopic Cholecystectomy.  A Time Out was held and the above information confirmed.  Prior to the induction of general anesthesia, antibiotic prophylaxis was administered. VTE prophylaxis was in place. General endotracheal anesthesia was then administered and tolerated well. After the induction, the abdomen was prepped with Chloraprep and draped in the sterile fashion. The patient was positioned in the supine position.  Local anesthetic was injected into the skin 2 fingers below the costal margin in the right midclavicular line and an incision was made. The Veress needle was placed. Pneumoperitoneum was then created with CO2 and tolerated well without any adverse changes in the patient's vital signs. A 22mm port was placed in the incision and the abdominal cavity was explored.  Two 5-mm ports were placed, one in the periumbilical and in the right upper quadrant and a 12 mm epigastric port was placed all under direct vision.  All skin incisions  were infiltrated with a local anesthetic agent before making the incision and placing the trocars.   The patient was positioned  in reverse Trendelenburg, tilted slightly to the patient's left.  The gallbladder was identified, the fundus grasped and retracted cephalad. Adhesions were lysed bluntly. The infundibulum was noted to be dense but was able to be grasped and retracted laterally, exposing the peritoneum overlying the triangle of Calot. This was then divided and exposed in a blunt fashion. A critical view of the cystic duct and cystic artery was obtained.  The cystic duct was clearly identified and bluntly dissected.   The duct was serially clipped and cut after the view of safety was obtained. During the process of doing this the body of the gallbladder was torn by retracting grasper spilling bile into the abdomen. It was removed via suction irrigator. This also had bleeding from an arterial source or. The cystic artery was then serially clipped and cut which stopped the bleeding.  The gallbladder was taken from the gallbladder fossa in a retrograde fashion with the electrocautery. The gallbladder was removed and placed in an Endocatch bag. The liver bed was irrigated and inspected. Hemostasis was achieved with the electrocautery. Copious irrigation was utilized and was repeatedly aspirated until clear.  The gallbladder was noted to be too large for incision. The incision was sharply and bluntly enlarged. Unfortunately during the process of doing so the bag was cut and torn away from the gallbladder itself. The gallbladder was then removed through the epigastric port site.   Inspection of the right upper quadrant was performed. No bleeding, bile duct injury or leak, or bowel injury was noted. Pneumoperitoneum was released.  The epigastric port site was closed with figure-of-eight 0 Vicryl  sutures. 4-0 subcuticular Monocryl was used to close the skin. Steristrips and Mastisol and  sterile dressings were  applied.  The patient was then extubated and brought to the recovery room in stable condition. Sponge, lap, and needle counts were correct at closure and at the conclusion of the case.   Findings: Chronic Cholecystitis   Estimated Blood Loss: 20 mL         Drains: None         Specimens: Gallbladder           Complications: none               Anakin Varkey T. Adonis Huguenin, MD, FACS

## 2016-12-10 NOTE — Anesthesia Procedure Notes (Signed)
Procedure Name: Intubation Date/Time: 12/10/2016 12:43 PM Performed by: Nelda Marseille Pre-anesthesia Checklist: Patient identified, Patient being monitored, Timeout performed, Emergency Drugs available and Suction available Patient Re-evaluated:Patient Re-evaluated prior to inductionOxygen Delivery Method: Circle system utilized Preoxygenation: Pre-oxygenation with 100% oxygen Intubation Type: IV induction Ventilation: Mask ventilation without difficulty Laryngoscope Size: Mac and 3 Grade View: Grade I Tube type: Oral Tube size: 7.0 mm Number of attempts: 1 Airway Equipment and Method: Stylet Placement Confirmation: ETT inserted through vocal cords under direct vision,  positive ETCO2 and breath sounds checked- equal and bilateral Secured at: 21 cm Tube secured with: Tape Dental Injury: Teeth and Oropharynx as per pre-operative assessment

## 2016-12-10 NOTE — Transfer of Care (Signed)
Immediate Anesthesia Transfer of Care Note  Patient: Therese D Castile  Procedure(s) Performed: Procedure(s): LAPAROSCOPIC CHOLECYSTECTOMY (N/A)  Patient Location: PACU  Anesthesia Type:General  Level of Consciousness: awake, alert  and oriented  Airway & Oxygen Therapy: Patient Spontanous Breathing and Patient connected to face mask oxygen  Post-op Assessment: Report given to RN and Post -op Vital signs reviewed and stable  Post vital signs: Reviewed and stable  Last Vitals:  Vitals:   12/10/16 1117 12/10/16 1402  BP: (!) 151/83 139/85  Pulse: 84 87  Resp: 20 15  Temp: 36.7 C 36.6 C    Last Pain:  Vitals:   12/10/16 1117  TempSrc: Oral  PainSc: 0-No pain         Complications: No apparent anesthesia complications

## 2016-12-10 NOTE — Anesthesia Postprocedure Evaluation (Signed)
Anesthesia Post Note  Patient: Rebecca Burton  Procedure(s) Performed: Procedure(s) (LRB): LAPAROSCOPIC CHOLECYSTECTOMY (N/A)  Patient location during evaluation: PACU Anesthesia Type: General Level of consciousness: awake and alert and oriented Pain management: pain level controlled Vital Signs Assessment: post-procedure vital signs reviewed and stable Respiratory status: spontaneous breathing, nonlabored ventilation and respiratory function stable Cardiovascular status: blood pressure returned to baseline and stable Postop Assessment: no signs of nausea or vomiting Anesthetic complications: no     Last Vitals:  Vitals:   12/10/16 1442 12/10/16 1447  BP:  (!) 149/75  Pulse: 78 76  Resp: 15 12  Temp:  36.8 C    Last Pain:  Vitals:   12/10/16 1447  TempSrc:   PainSc: 3                  [name]

## 2016-12-10 NOTE — Anesthesia Post-op Follow-up Note (Cosign Needed)
Anesthesia QCDR form completed.        

## 2016-12-10 NOTE — Discharge Instructions (Signed)
Laparoscopic Cholecystectomy, Care After This sheet gives you information about how to care for yourself after your procedure. Your health care provider may also give you more specific instructions. If you have problems or questions, contact your health care provider. What can I expect after the procedure? After the procedure, it is common to have:  Pain at your incision sites. You will be given medicines to control this pain.  Mild nausea or vomiting.  Bloating and possible shoulder pain from the air-like gas that was used during the procedure. Follow these instructions at home: Incision care    Follow instructions from your health care provider about how to take care of your incisions. Make sure you:  Wash your hands with soap and water before you change your bandage (dressing). If soap and water are not available, use hand sanitizer.  Change your dressing as told by your health care provider. Remove outer dressing after shower in 48 hours. Replaced with large bandages until drainage stops.  Leave stitches (sutures), skin glue, or adhesive strips in place. These skin closures may need to be in place for 2 weeks or longer. If adhesive strip edges start to loosen and curl up, you may trim the loose edges. Do not remove adhesive strips completely unless your health care provider tells you to do that.  Do not take baths, swim, or use a hot tub until your health care provider approves. Ask your health care provider if you can take showers. You may only be allowed to take sponge baths for bathing. Okay to shower in 48 hours.  Check your incision area every day for signs of infection. Check for:  More redness, swelling, or pain.  More fluid or blood.  Warmth.  Pus or a bad smell. Activity   Do not drive or use heavy machinery while taking prescription pain medicine.  Do not lift anything that is heavier than 10 lb (4.5 kg) until your health care provider approves.  Do not play  contact sports until your health care provider approves.  Do not drive for 24 hours if you were given a medicine to help you relax (sedative).  Rest as needed. Do not return to work or school until your health care provider approves. General instructions   Take over-the-counter and prescription medicines only as told by your health care provider.  To prevent or treat constipation while you are taking prescription pain medicine, your health care provider may recommend that you:  Drink enough fluid to keep your urine clear or pale yellow.  Take over-the-counter or prescription medicines.  Eat foods that are high in fiber, such as fresh fruits and vegetables, whole grains, and beans.  Limit foods that are high in fat and processed sugars, such as fried and sweet foods. Contact a health care provider if:  You develop a rash.  You have more redness, swelling, or pain around your incisions.  You have more fluid or blood coming from your incisions.  Your incisions feel warm to the touch.  You have pus or a bad smell coming from your incisions.  You have a fever.  One or more of your incisions breaks open. Get help right away if:  You have trouble breathing.  You have chest pain.  You have increasing pain in your shoulders.  You faint or feel dizzy when you stand.  You have severe pain in your abdomen.  You have nausea or vomiting that lasts for more than one day.  You have leg  pain. This information is not intended to replace advice given to you by your health care provider. Make sure you discuss any questions you have with your health care provider. Document Released: 07/05/2005 Document Revised: 01/24/2016 Document Reviewed: 12/22/2015 Elsevier Interactive Patient Education  2017 [place]   1) The drugs that you were given will stay in your system until tomorrow so for the next 24 hours you should not:  A) Drive an  automobile B) Make any legal decisions C) Drink any alcoholic beverage   2) You may resume regular meals tomorrow.  Today it is better to start with liquids and gradually work up to solid foods.  You may eat anything you prefer, but it is better to start with liquids, then soup and crackers, and gradually work up to solid foods.   3) Please notify your doctor immediately if you have any unusual bleeding, trouble breathing, redness and pain at the surgery site, drainage, fever, or pain not relieved by medication.    4) Additional Instructions:Splint abdominal incisions with a small pillow or towel when coughing, sneezing or moving.        Please contact your physician with any problems or Same Day Surgery at [phone], Monday through Friday 6 am to 4 pm, or [place] at [facility] number at [phone].

## 2016-12-10 NOTE — H&P (View-Only) (Signed)
Outpatient Surgical Follow Up  12/03/2016  Rebecca Burton is an 66 y.o. female.   Chief Complaint  Patient presents with  . Follow-up    Abdominal pain    HPI: 66 year old female who is known to the surgery department returns to clinic for evaluation of upper abdominal pain. Patient has been evaluated for this before and was sent for GI evaluation. She has completed her GI workup and continues to have intermittent upper abdominal pain that starts on her breast bone and shoots around to her back bilaterally. It usually appears to be after eating. She then developed subjective nausea but no vomiting. It is happening every day and it does not matter what she eats. She denies any fevers, chills, vomiting, diarrhea, constipation. She is becoming very frustrated with the abdominal pain and desires to have it fixed if possible.  Past Medical History:  Diagnosis Date  . Acid reflux   . Cervical lesion 09/12/2015  . Diverticulitis 11/01/2016  . GERD (gastroesophageal reflux disease) 11/01/2016  . HTN (hypertension) 11/01/2016  . Hypertension   . Pelvic pain in female 11/01/2016    Past Surgical History:  Procedure Laterality Date  . COLON SURGERY    . COLONOSCOPY WITH PROPOFOL N/A 11/25/2016   Procedure: COLONOSCOPY WITH PROPOFOL;  Surgeon: Lucilla Lame, MD;  Location: [place];  Service: Endoscopy;  Laterality: N/A;  . ESOPHAGEAL DILATION  11/25/2016   Procedure: ESOPHAGEAL DILATION;  Surgeon: Lucilla Lame, MD;  Location: [place];  Service: Endoscopy;;  . ESOPHAGOGASTRODUODENOSCOPY (EGD) WITH PROPOFOL N/A 11/25/2016   Procedure: ESOPHAGOGASTRODUODENOSCOPY (EGD) WITH PROPOFOL;  Surgeon: Lucilla Lame, MD;  Location: [place];  Service: Endoscopy;  Laterality: N/A;  . LAPAROSCOPIC SIGMOID COLECTOMY  09/2009    Family History  Problem Relation Age of Onset  . Breast cancer Maternal Grandmother   . Heart disease Mother   . Hypertension Mother   . Thyroid cancer  Mother     Social History:  reports that she has never smoked. She has never used smokeless tobacco. She reports that she does not drink alcohol or use drugs.  Allergies:  Allergies  Allergen Reactions  . Enalapril Swelling    Critical  . Macrolides And Ketolides Nausea And Vomiting    Yeast infection  . Naproxen Sodium Nausea And Vomiting    Pt does not think she has a problem with this medication    Medications reviewed.    ROS A multipoint review of systems was completed, all pertinent positives and negatives are documented within the history of present illness and remainder are negative   BP (!) 159/92   Pulse 81   Temp 98.2 F (36.8 C) (Oral)   Ht 5\' 5"  (1.651 m)   Wt 102.9 kg (226 lb 12.8 oz)   BMI 37.74 kg/m   Physical Exam Gen.: No acute distress Neck: Supple and nontender Chest: Clear to auscultation Heart: Regular rhythm Abdomen: Large, soft, nontender. Well-healed lower midline incision from prior colon surgery. Extremities: Moves all extremities well.    No results found for this or any previous visit (from the past 48 hour(s)). No results found.  Assessment/Plan:  1. Abdominal pain, epigastric 66 year old female with atypical abdominal pain and a known history of gallstones. I discussed the procedure of a laparoscopic cholecystectomy in detail. We discussed the risks and benefits of a laparoscopic cholecystectomy and possible cholangiogram including, but not limited to bleeding, infection, injury to surrounding structures such as the intestine or liver, bile leak, retained  gallstones, need to convert to an open procedure, prolonged diarrhea, blood clots such as  DVT, common bile duct injury, anesthesia risks, and possible need for additional procedures.  The likelihood of improvement in symptoms and return to the patient's normal status is good. We discussed the typical post-operative recovery course. Patient voiced understanding and desires to proceed as  soon as possible. Plan for surgery next Friday, May 25.  A total of 25 minutes was used on this encounter with greater than 50% of the time used for counseling her coordination of care.      Clayburn Pert, MD [facility] General Surgeon  12/03/2016,9:55 AM

## 2016-12-10 NOTE — OR Nursing (Signed)
Dr. Woodham in to see pt.  

## 2016-12-10 NOTE — Anesthesia Preprocedure Evaluation (Addendum)
Anesthesia Evaluation  Patient identified by MRN, date of birth, ID band Patient awake    Reviewed: Allergy & Precautions, NPO status , Patient's Chart, lab work & pertinent test results, reviewed documented beta blocker date and time   Airway Mallampati: III  TM Distance: >3 FB     Dental  (+) Chipped   Pulmonary           Cardiovascular hypertension, Pt. on medications      Neuro/Psych    GI/Hepatic GERD  ,  Endo/Other    Renal/GU      Musculoskeletal   Abdominal   Peds  Hematology   Anesthesia Other Findings iRBBB noted. No significant change from previous. No cardiac symptoms. Echo and stress OK (done 6 months ago).  Reproductive/Obstetrics                           Anesthesia Physical Anesthesia Plan  ASA: II  Anesthesia Plan: General   Post-op Pain Management:    Induction: Intravenous  Airway Management Planned: Oral ETT  Additional Equipment:   Intra-op Plan:   Post-operative Plan:   Informed Consent: I have reviewed the patients History and Physical, chart, labs and discussed the procedure including the risks, benefits and alternatives for the proposed anesthesia with the patient or authorized representative who has indicated his/her understanding and acceptance.     Plan Discussed with: CRNA  Anesthesia Plan Comments:         Anesthesia Quick Evaluation

## 2016-12-10 NOTE — Brief Op Note (Signed)
12/10/2016  1:53 PM  PATIENT:  Rebecca Burton  66 y.o. female  PRE-OPERATIVE DIAGNOSIS:  ABDOMINAL PAIN EPIGASTRIC  POST-OPERATIVE DIAGNOSIS:  ABDOMINAL PAIN EPIGASTRIC  PROCEDURE:  Procedure(s): LAPAROSCOPIC CHOLECYSTECTOMY (N/A)  SURGEON:  Surgeon(s) and Role:    * Clayburn Pert, MD - Primary  PHYSICIAN ASSISTANT:   ASSISTANTS: none   ANESTHESIA:   general  EBL:  No intake/output data recorded.  BLOOD ADMINISTERED:none  DRAINS: none   LOCAL MEDICATIONS USED:  MARCAINE   , XYLOCAINE  and Amount: 25 ml  SPECIMEN:  Source of Specimen:  gallbladder  DISPOSITION OF SPECIMEN:  PATHOLOGY  COUNTS:  YES  TOURNIQUET:  * No tourniquets in log *  DICTATION: .Dragon Dictation  PLAN OF CARE: Discharge to home after PACU  PATIENT DISPOSITION:  PACU - hemodynamically stable.   Delay start of Pharmacological VTE agent (>24hrs) due to surgical blood loss or risk of bleeding: not applicable

## 2016-12-11 ENCOUNTER — Encounter: Payer: Self-pay | Admitting: General Surgery

## 2016-12-15 LAB — SURGICAL PATHOLOGY

## 2016-12-21 DIAGNOSIS — R9389 Abnormal findings on diagnostic imaging of other specified body structures: Secondary | ICD-10-CM | POA: Insufficient documentation

## 2016-12-22 ENCOUNTER — Ambulatory Visit (INDEPENDENT_AMBULATORY_CARE_PROVIDER_SITE_OTHER): Payer: Medicare Other | Admitting: General Surgery

## 2016-12-22 ENCOUNTER — Encounter: Payer: Self-pay | Admitting: General Surgery

## 2016-12-22 VITALS — BP 140/99 | HR 87 | Temp 98.8°F | Ht 63.0 in | Wt 225.0 lb

## 2016-12-22 DIAGNOSIS — Z4889 Encounter for other specified surgical aftercare: Secondary | ICD-10-CM

## 2016-12-22 NOTE — Patient Instructions (Signed)
Please call our office with any questions or concerns.  Please do not submerge in a tub, hot tub, or pool until incisions are completely sealed.  Use sun block to incision area over the next year if this area will be exposed to sun. This helps decrease scarring.  You may resume your normal activities on 01/21/17. At that time- Listen to your body when lifting, if you have pain when lifting, stop and then try again in a few days. Pain after doing exercises or activities of daily living is normal as you get back in to your normal routine.  If you develop redness, drainage, or pain at incision sites- call our office immediately and speak with a nurse.

## 2016-12-22 NOTE — Progress Notes (Signed)
Outpatient Surgical Follow Up  12/22/2016  Rebecca Burton is an 66 y.o. female.   Chief Complaint  Patient presents with  . Routine Post Op    Laparoscopic Cholecystectomy (12/10/16)- Dr. Adonis Huguenin    HPI: 66 year old female returns to clinic for follow-up 2 weeks status post laparoscopic cholecystectomy. Patient reports doing very well. She is not having any pain. She is now eating whatever she wants surgery with resolution of her preoperative symptoms. She denies any fevers, chills, nausea, vomiting, chest pain, shortness of breath, diarrhea, constipation.  Past Medical History:  Diagnosis Date  . Acid reflux   . Cervical lesion 09/12/2015  . Diverticulitis 11/01/2016  . GERD (gastroesophageal reflux disease) 11/01/2016  . HTN (hypertension) 11/01/2016  . Hypertension   . Pelvic pain in female 11/01/2016    Past Surgical History:  Procedure Laterality Date  . CHOLECYSTECTOMY N/A 12/10/2016   Procedure: LAPAROSCOPIC CHOLECYSTECTOMY;  Surgeon: Clayburn Pert, MD;  Location: [facility];  Service: General;  Laterality: N/A;  . COLON SURGERY    . COLONOSCOPY WITH PROPOFOL N/A 11/25/2016   Procedure: COLONOSCOPY WITH PROPOFOL;  Surgeon: Lucilla Lame, MD;  Location: [place];  Service: Endoscopy;  Laterality: N/A;  . ESOPHAGEAL DILATION  11/25/2016   Procedure: ESOPHAGEAL DILATION;  Surgeon: Lucilla Lame, MD;  Location: [place];  Service: Endoscopy;;  . ESOPHAGOGASTRODUODENOSCOPY (EGD) WITH PROPOFOL N/A 11/25/2016   Procedure: ESOPHAGOGASTRODUODENOSCOPY (EGD) WITH PROPOFOL;  Surgeon: Lucilla Lame, MD;  Location: [place];  Service: Endoscopy;  Laterality: N/A;  . LAPAROSCOPIC SIGMOID COLECTOMY  09/2009    Family History  Problem Relation Age of Onset  . Breast cancer Maternal Grandmother   . Heart disease Mother   . Hypertension Mother   . Thyroid cancer Mother     Social History:  reports that she has never smoked. She has never used smokeless tobacco.  She reports that she does not drink alcohol or use drugs.  Allergies:  Allergies  Allergen Reactions  . Enalapril Swelling    Critical  . Hydrocodone Itching  . Macrolides And Ketolides Nausea And Vomiting    Yeast infection  . Naproxen Sodium Nausea And Vomiting    Pt does not think she has a problem with this medication    Medications reviewed.    ROS A multipoint review of systems was completed, all pertinent positives and negatives are documented in the history of present illness and remainder are negative   BP (!) 140/99   Pulse 87   Temp 98.8 F (37.1 C) (Oral)   Ht 5\' 3"  (1.6 m)   Wt 102.1 kg (225 lb)   BMI 39.86 kg/m   Physical Exam Gen.: no acute distress Chest, clear to all sedation Heart: Regular rhythm Abdomen: Soft, nontender, nondistended. Well approximated and healing laparoscopic incisions on evidence of erythema or drainage    No results found for this or any previous visit (from the past 51 hour(s)). No results found.  Assessment/Plan:  1. Aftercare following surgery 66 year old female status post lap scopic cholecystectomy. Pathology reviewed with the patient. Discussed anticipated recovery returning to normal activities. She voiced understanding all follow-up in clinic on an as needed basis.     Clayburn Pert, MD FACS General Surgeon  12/22/2016,10:26 AM

## 2017-09-06 ENCOUNTER — Other Ambulatory Visit: Payer: Self-pay | Admitting: Family Medicine

## 2017-09-06 DIAGNOSIS — Z1231 Encounter for screening mammogram for malignant neoplasm of breast: Secondary | ICD-10-CM

## 2017-09-21 ENCOUNTER — Ambulatory Visit
Admission: RE | Admit: 2017-09-21 | Discharge: 2017-09-21 | Disposition: A | Discharge status: Home / Self Care | Payer: Medicare Other | Source: Ambulatory Visit | Attending: Family Medicine | Admitting: Family Medicine

## 2017-09-21 DIAGNOSIS — Z1231 Encounter for screening mammogram for malignant neoplasm of breast: Secondary | ICD-10-CM | POA: Diagnosis present

## 2018-05-31 ENCOUNTER — Other Ambulatory Visit: Payer: Self-pay | Admitting: Obstetrics and Gynecology

## 2018-05-31 DIAGNOSIS — Z1231 Encounter for screening mammogram for malignant neoplasm of breast: Secondary | ICD-10-CM

## 2018-06-10 IMAGING — CR DG CHEST 2V
1 series · 2 of 2 positions shown · non-contrast
Comparison: 07/24/2009

CLINICAL DATA: Preop gallbladder surgery history of hypertension

EXAM:
CHEST  2 VIEW

[Series 1: dg chest 2 view · 0.14mm/px · 2 of 2 slices shown]
[im 1/2]
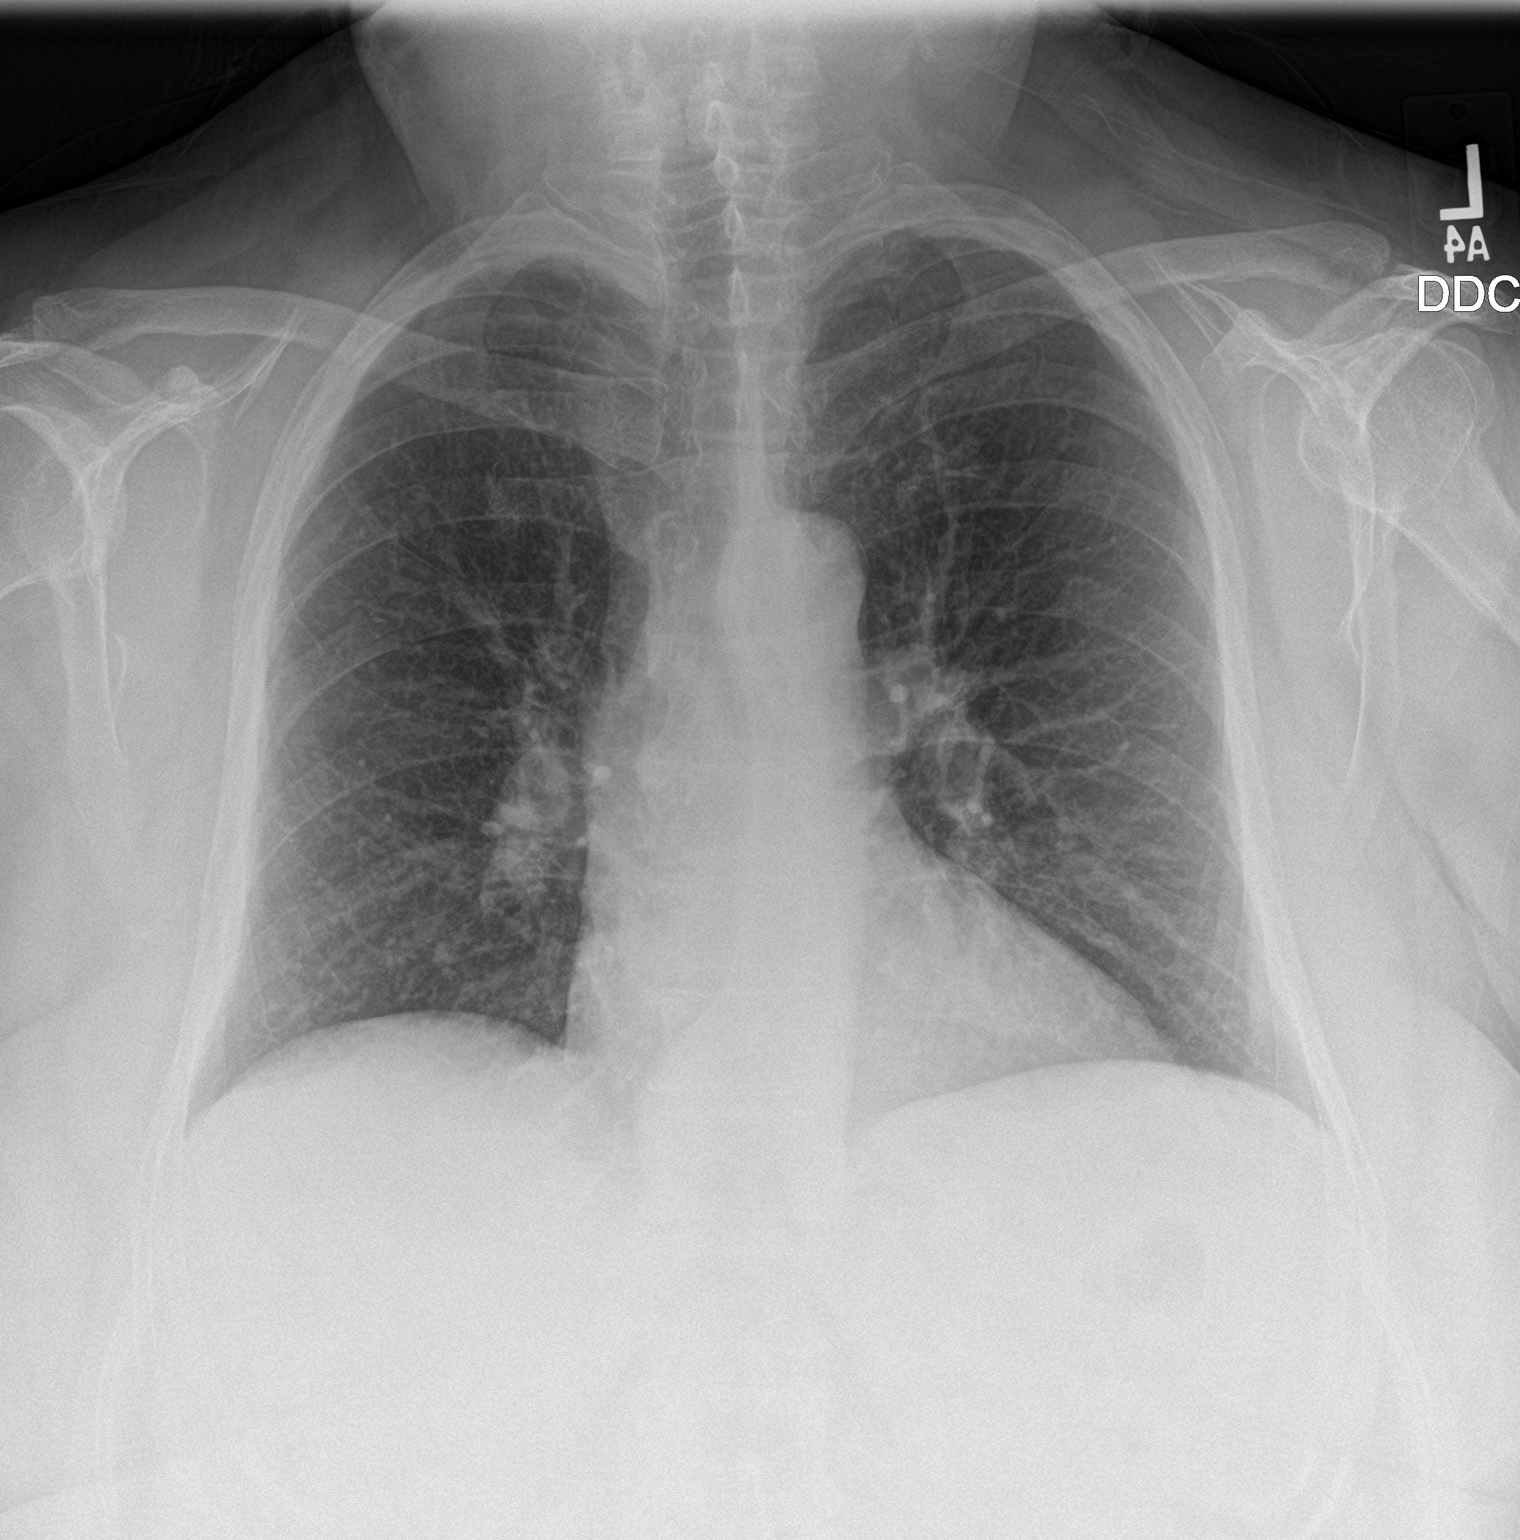
[im 2/2]
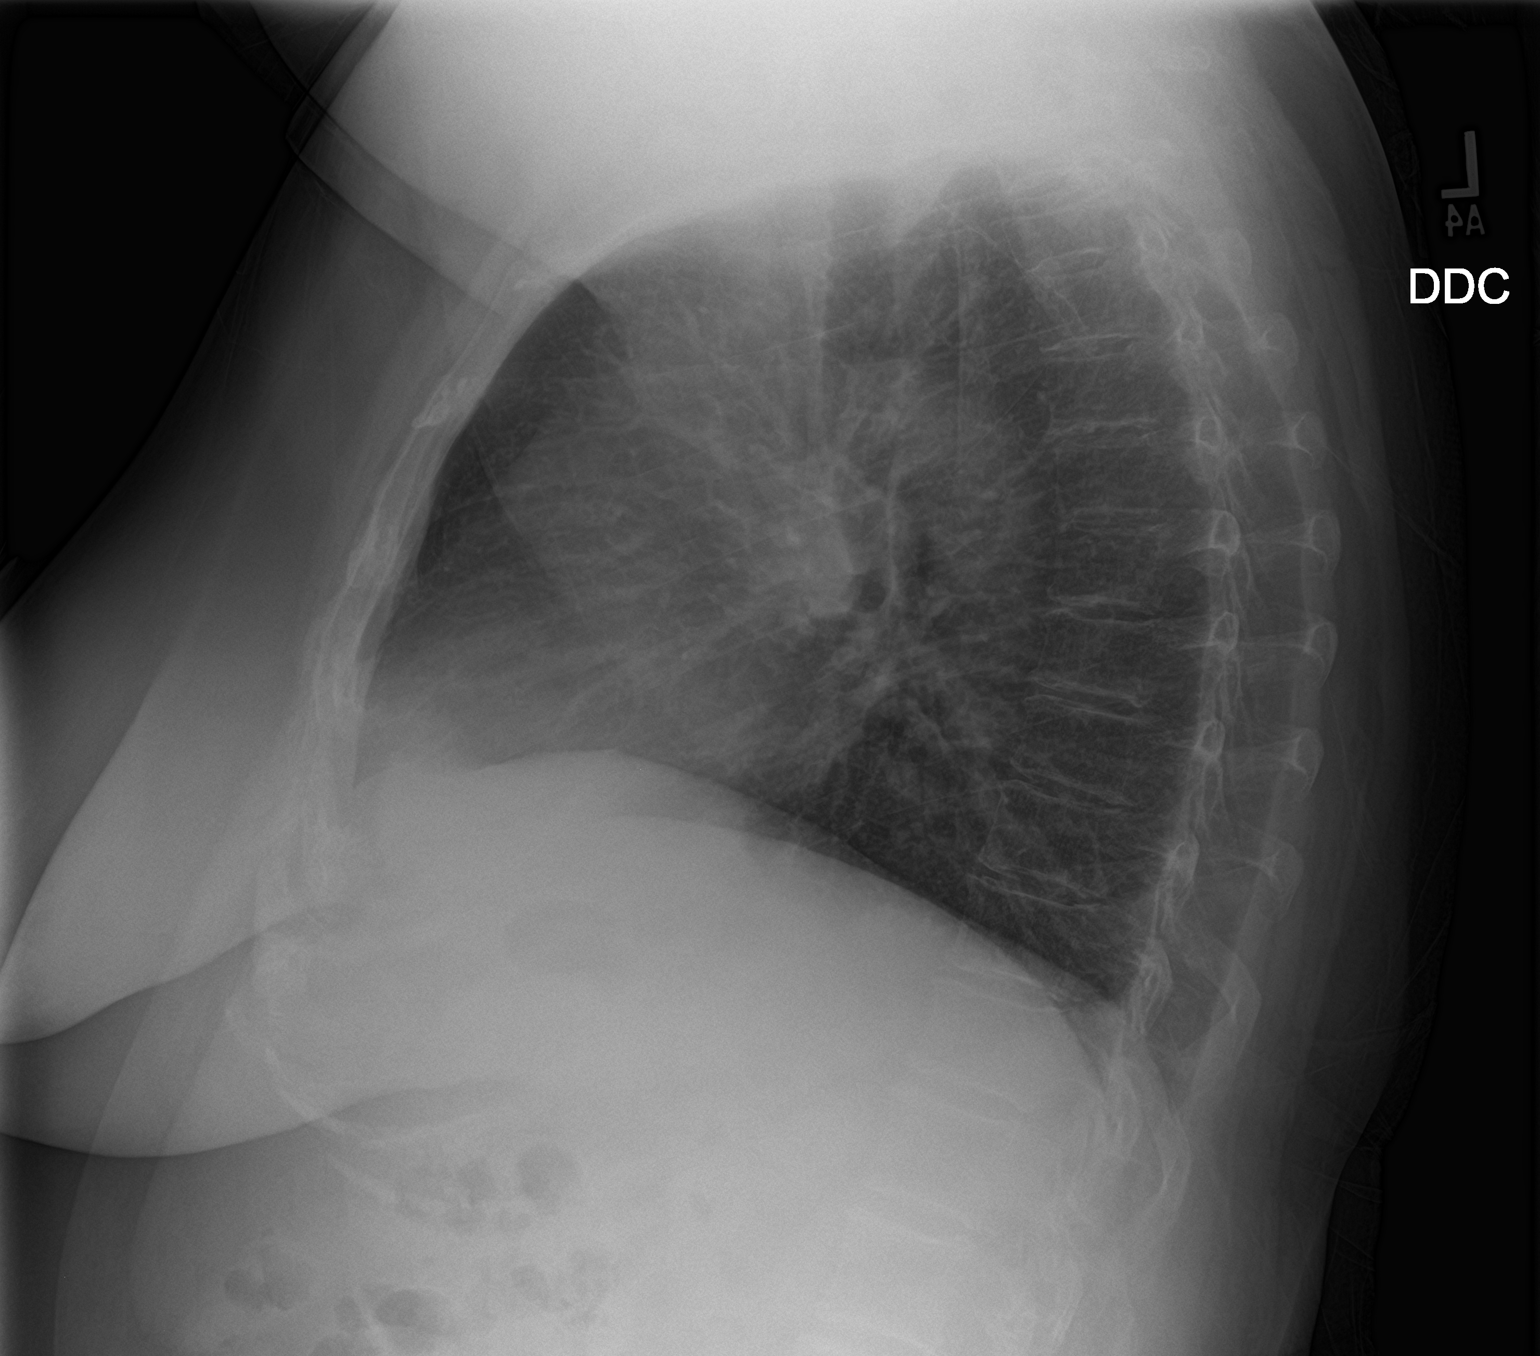

[2 of 2 positions shown; findings below may reference images not displayed]

FINDINGS: The heart size and mediastinal contours are within normal limits.
Both lungs are clear. The visualized skeletal structures are
unremarkable.
IMPRESSION: No active cardiopulmonary disease.

## 2018-08-04 ENCOUNTER — Other Ambulatory Visit: Payer: Self-pay | Admitting: Nurse Practitioner

## 2018-08-04 DIAGNOSIS — M8580 Other specified disorders of bone density and structure, unspecified site: Secondary | ICD-10-CM

## 2018-09-25 ENCOUNTER — Ambulatory Visit
Admission: RE | Admit: 2018-09-25 | Discharge: 2018-09-25 | Disposition: A | Discharge status: Home / Self Care | Payer: Medicare Other | Source: Ambulatory Visit | Attending: Obstetrics and Gynecology | Admitting: Obstetrics and Gynecology

## 2018-09-25 ENCOUNTER — Ambulatory Visit
Admission: RE | Admit: 2018-09-25 | Discharge: 2018-09-25 | Disposition: A | Discharge status: Home / Self Care | Payer: Medicare Other | Source: Ambulatory Visit | Attending: Nurse Practitioner | Admitting: Nurse Practitioner

## 2018-09-25 DIAGNOSIS — M8580 Other specified disorders of bone density and structure, unspecified site: Secondary | ICD-10-CM | POA: Insufficient documentation

## 2018-09-25 DIAGNOSIS — M8588 Other specified disorders of bone density and structure, other site: Secondary | ICD-10-CM | POA: Diagnosis not present

## 2018-09-25 DIAGNOSIS — Z1231 Encounter for screening mammogram for malignant neoplasm of breast: Secondary | ICD-10-CM | POA: Diagnosis present

## 2018-09-25 DIAGNOSIS — M85852 Other specified disorders of bone density and structure, left thigh: Secondary | ICD-10-CM | POA: Diagnosis not present

## 2019-06-05 ENCOUNTER — Other Ambulatory Visit: Payer: Self-pay | Admitting: Obstetrics and Gynecology

## 2019-06-05 DIAGNOSIS — Z1231 Encounter for screening mammogram for malignant neoplasm of breast: Secondary | ICD-10-CM

## 2019-09-26 ENCOUNTER — Ambulatory Visit
Admission: RE | Admit: 2019-09-26 | Discharge: 2019-09-26 | Disposition: A | Discharge status: Home / Self Care | Payer: Medicare HMO | Source: Ambulatory Visit | Attending: Obstetrics and Gynecology | Admitting: Obstetrics and Gynecology

## 2019-09-26 DIAGNOSIS — Z1231 Encounter for screening mammogram for malignant neoplasm of breast: Secondary | ICD-10-CM | POA: Insufficient documentation

## 2019-12-04 ENCOUNTER — Ambulatory Visit: Payer: Medicare HMO | Admitting: Dermatology

## 2019-12-04 ENCOUNTER — Other Ambulatory Visit: Payer: Self-pay

## 2019-12-04 DIAGNOSIS — L821 Other seborrheic keratosis: Secondary | ICD-10-CM

## 2019-12-04 DIAGNOSIS — D229 Melanocytic nevi, unspecified: Secondary | ICD-10-CM

## 2019-12-04 DIAGNOSIS — D1801 Hemangioma of skin and subcutaneous tissue: Secondary | ICD-10-CM | POA: Diagnosis not present

## 2019-12-04 DIAGNOSIS — L570 Actinic keratosis: Secondary | ICD-10-CM

## 2019-12-04 DIAGNOSIS — L820 Inflamed seborrheic keratosis: Secondary | ICD-10-CM | POA: Diagnosis not present

## 2019-12-04 DIAGNOSIS — Z1283 Encounter for screening for malignant neoplasm of skin: Secondary | ICD-10-CM

## 2019-12-04 DIAGNOSIS — I8393 Asymptomatic varicose veins of bilateral lower extremities: Secondary | ICD-10-CM

## 2019-12-04 DIAGNOSIS — L578 Other skin changes due to chronic exposure to nonionizing radiation: Secondary | ICD-10-CM

## 2019-12-04 DIAGNOSIS — L814 Other melanin hyperpigmentation: Secondary | ICD-10-CM

## 2019-12-04 NOTE — Patient Instructions (Addendum)
Recommend daily broad spectrum sunscreen SPF 30+ to sun-exposed areas, reapply every 2 hours as needed. Call for new or changing lesions.  Cryotherapy Aftercare  . Wash gently with soap and water everyday.   . Apply Vaseline and Band-Aid daily until healed.  Prior to procedure, discussed risks of blister formation, small wound, skin dyspigmentation, or rare scar following cryotherapy.   

## 2019-12-04 NOTE — Progress Notes (Signed)
Follow-Up Visit   Subjective  Rebecca Burton is a 69 y.o. female who presents for the following: Annual Exam.  Patient here today for TBSE. Patient has a few spots on her face and one on her mid chest that are sore sometimes, present for months but they come and go.  She has a h/o AKs and patient advises she has used topical cream to treat AK's in the past.  It made her face very red and sore.  The following portions of the chart were reviewed this encounter and updated as appropriate:     Review of Systems:  No other skin or systemic complaints except as noted in HPI or Assessment and Plan.  Objective  Well appearing patient in no apparent distress; mood and affect are within normal limits.  A full examination was performed including scalp, head, eyes, ears, nose, lips, neck, chest, axillae, abdomen, back, buttocks, bilateral upper extremities, bilateral lower extremities, hands, feet, fingers, toes, fingernails, and toenails. All findings within normal limits unless otherwise noted below.  Objective  Left Ear Helix x 1, L malar cheek x 3, nose x 3 (7): Erythematous thin papules/macules with gritty scale.   Objective  Right Lateral Eyebrow x 1, Mid Sternum x 1 (2): Erythematous keratotic waxy stuck-on papule  Objective  Right Anterior Thigh: 1.5 x 1.0cm waxy brown patch with irregular border   Assessment & Plan  AK (actinic keratosis) (7) Left Ear Helix x 1, L malar cheek x 3, nose x 3  Will plan PDT treatment to entire face in the fall x 2, 1 month apart.   Destruction of lesion - Left Ear Helix x 1, L malar cheek x 3, nose x 3  Destruction method: cryotherapy   Informed consent: discussed and consent obtained   Lesion destroyed using liquid nitrogen: Yes   Region frozen until ice ball extended beyond lesion: Yes   Outcome: patient tolerated procedure well with no complications   Post-procedure details: wound care instructions given    Inflamed seborrheic keratosis  (2) Right Lateral Eyebrow x 1, Mid Sternum x 1  Destruction of lesion - Right Lateral Eyebrow x 1, Mid Sternum x 1  Destruction method: cryotherapy   Informed consent: discussed and consent obtained   Lesion destroyed using liquid nitrogen: Yes   Region frozen until ice ball extended beyond lesion: Yes   Outcome: patient tolerated procedure well with no complications   Post-procedure details: wound care instructions given    Seborrheic keratosis Right Anterior Thigh  Benign, observe.      Lentigines - Scattered tan macules - Discussed due to sun exposure - Benign, observe - Call for any changes  Seborrheic Keratoses - Stuck-on, waxy, tan-brown papules and plaques  - Discussed benign etiology and prognosis. - Observe - Call for any changes  Melanocytic Nevi - Tan-brown and/or pink-flesh-colored symmetric macules and papules - Benign appearing on exam today - Observation - Call clinic for new or changing moles - Recommend daily use of broad spectrum spf 30+ sunscreen to sun-exposed areas.   Hemangiomas - Red papules - Discussed benign nature - Observe - Call for any changes  Actinic Damage - diffuse scaly erythematous macules with underlying dyspigmentation - Recommend daily broad spectrum sunscreen SPF 30+ to sun-exposed areas, reapply every 2 hours as needed.  - Call for new or changing lesions.  Skin cancer screening performed today.  Varicose Veins - Dilated blue, purple or red veins at the lower extremities - Reassured - These can  be treated by sclerotherapy (a procedure to inject a medicine into the veins to make them disappear) if desired, but the treatment is not covered by insurance   Return for PDT to entire face in the fall x 2, 1 month apart (Oct and Nov), January with Dr. Chauncey Cruel, [place] FOLLOW UP.  Graciella Belton, RMA, am acting as scribe for Brendolyn Patty, MD .  Documentation: I have reviewed the above documentation for accuracy and completeness, and I  agree with the above.  Brendolyn Patty MD

## 2020-03-28 IMAGING — MG DIGITAL SCREENING BILATERAL MAMMOGRAM WITH TOMO AND CAD
8 series · 8 of 24 positions shown · non-contrast
Comparison: Previous exam(s).

CLINICAL DATA: Screening.

EXAM:
DIGITAL SCREENING BILATERAL MAMMOGRAM WITH TOMO AND CAD

[L MLO synth-2D]
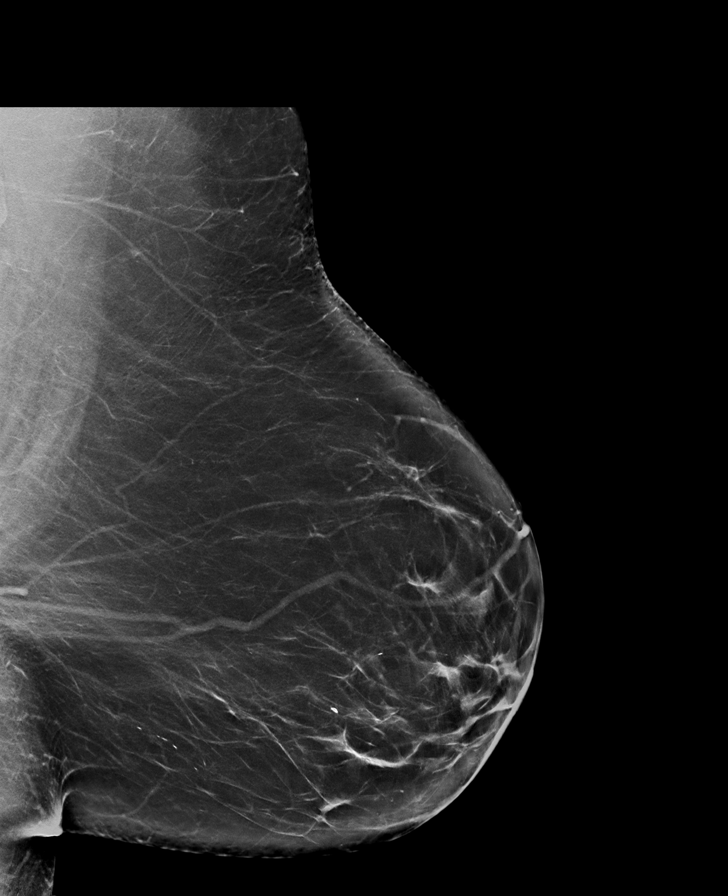

[R CC synth-2D]
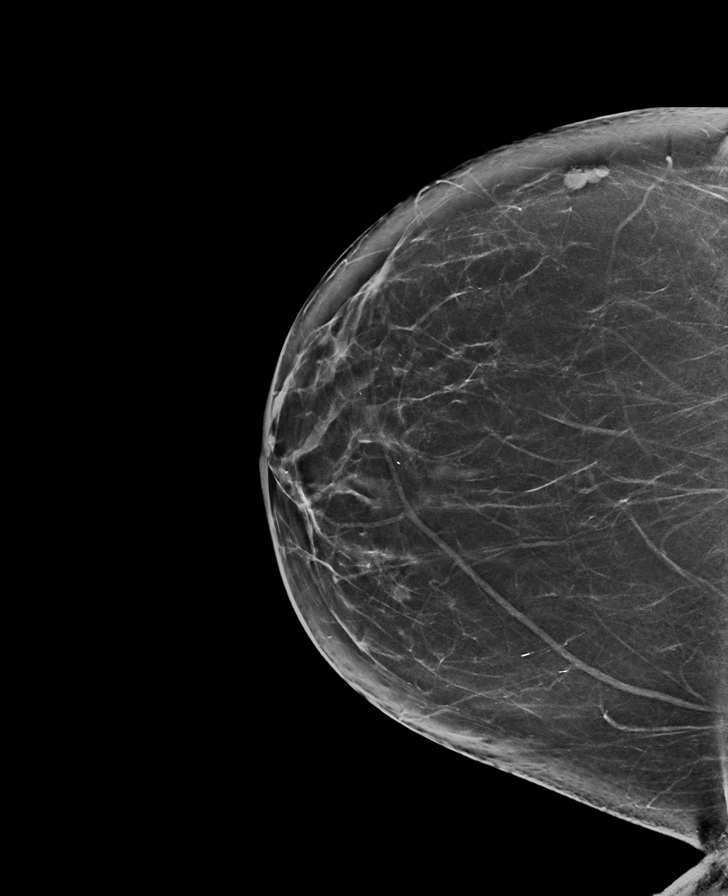

[R MLO synth-2D]
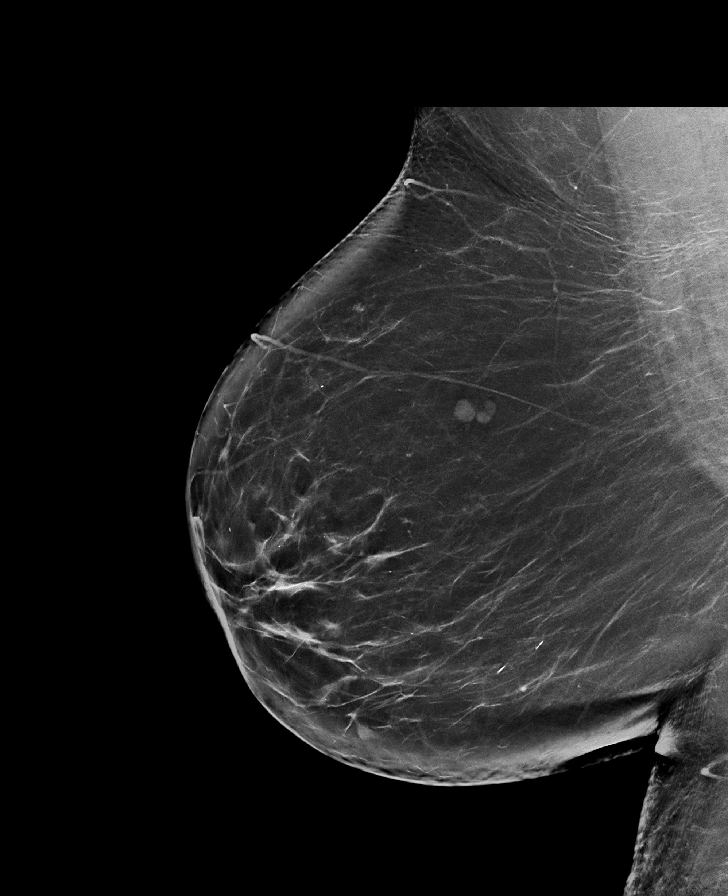

[L CC synth-2D]
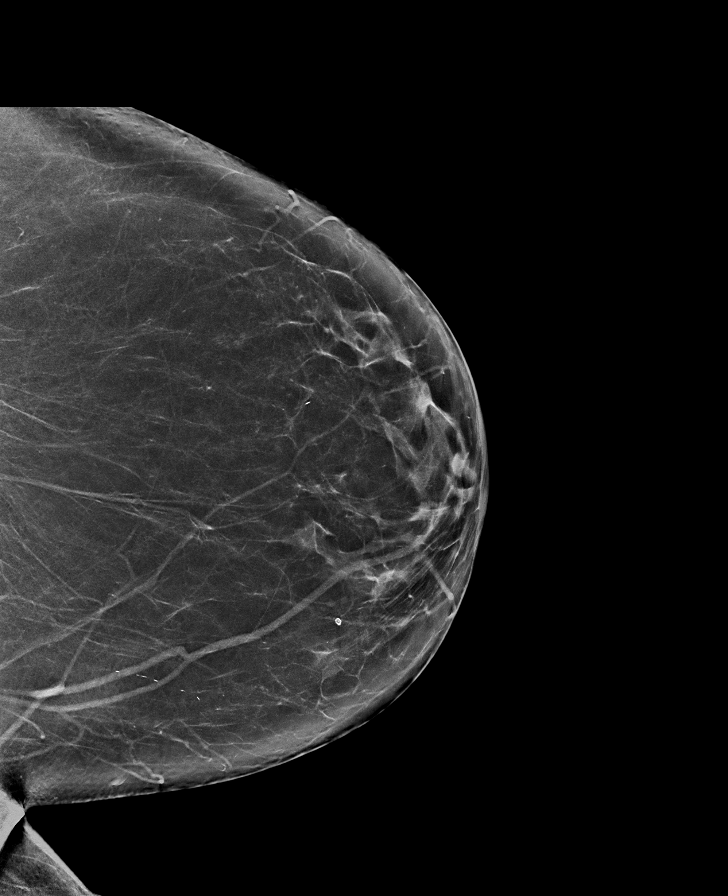

[L MLO tomo · tomo slice 47/93.0]
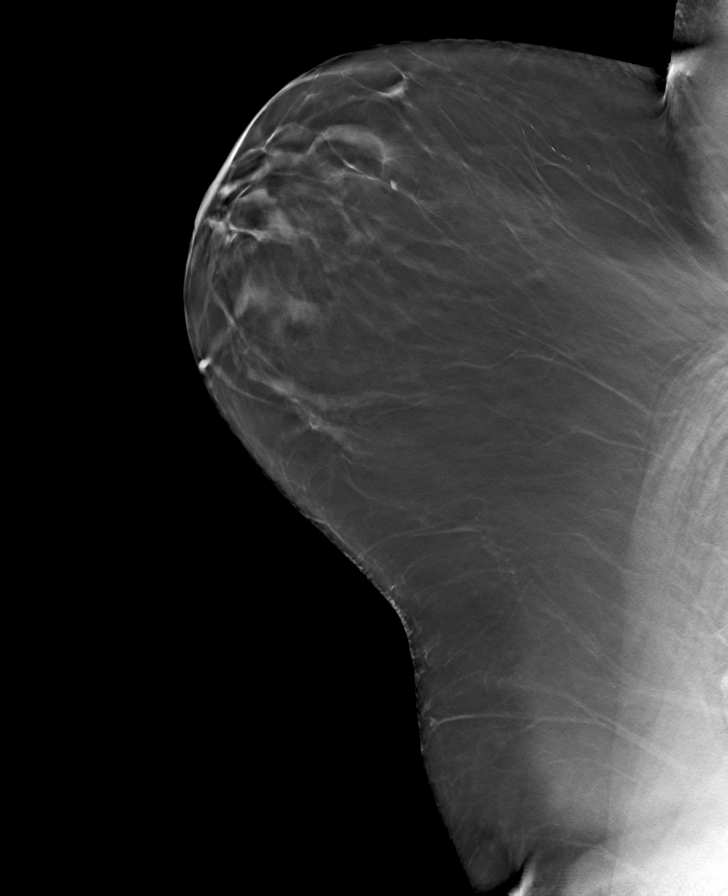

[R CC tomo · tomo slice 39/77.0]
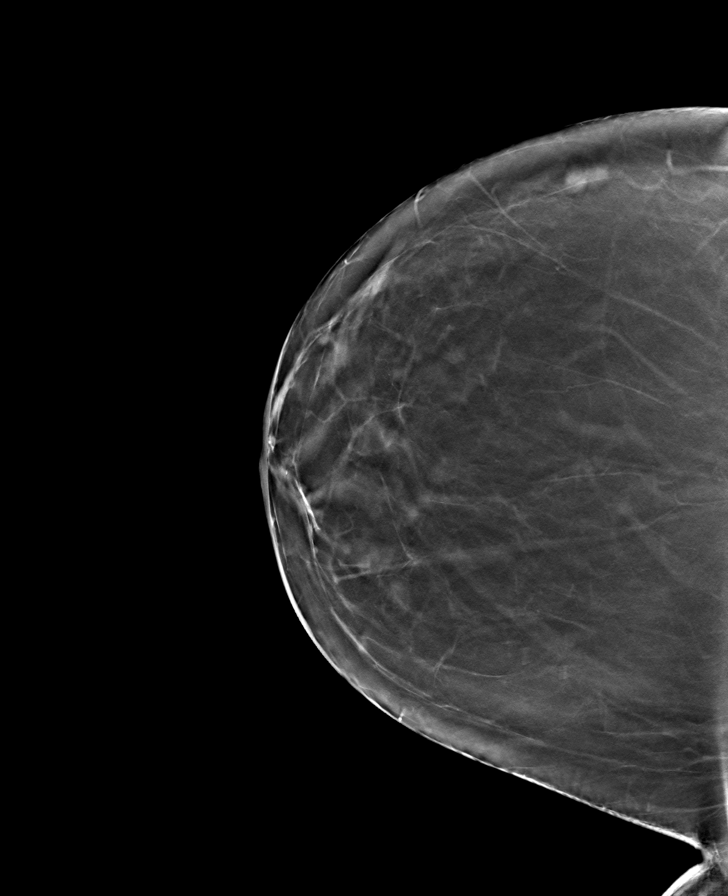

[L CC tomo · tomo slice 39/76.0]
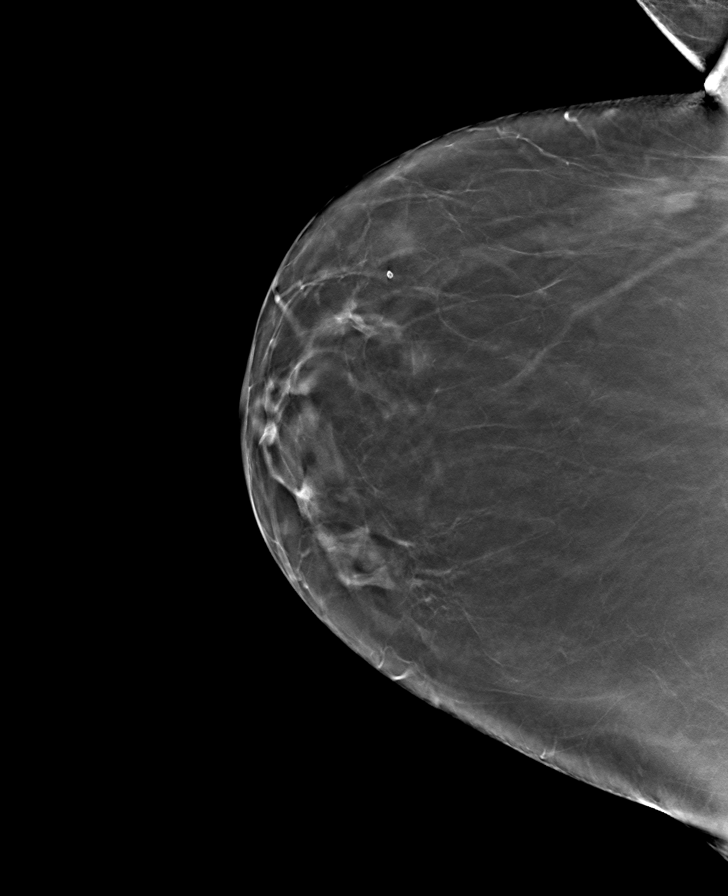

[R MLO tomo · tomo slice 45/90.0]
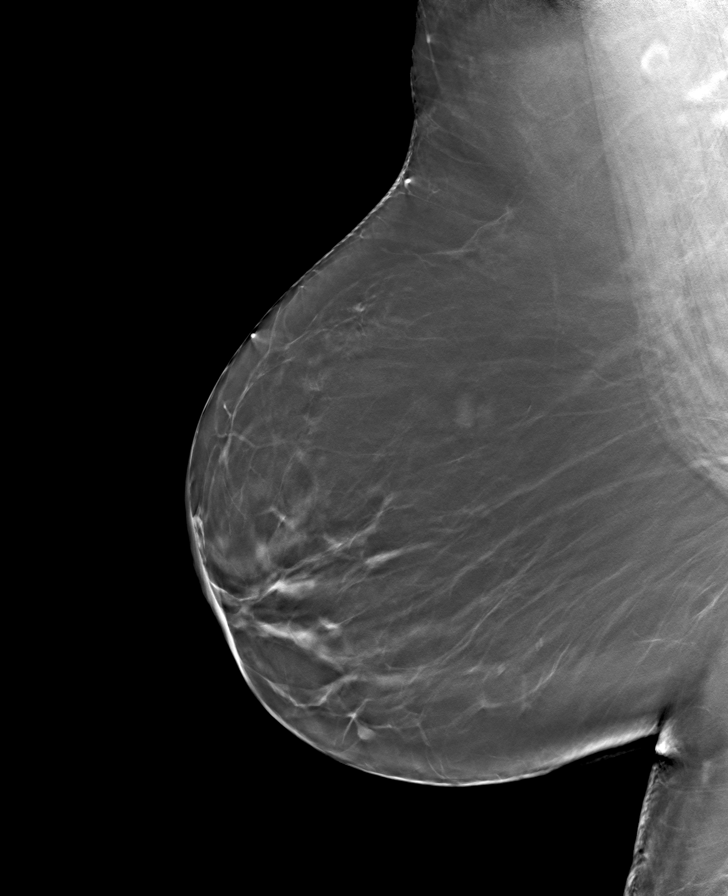

[8 of 24 positions shown; findings below may reference images not displayed]

ACR Breast Density Category b: There are scattered areas of
fibroglandular density.
FINDINGS: There are no findings suspicious for malignancy. Images were
processed with CAD.
IMPRESSION: No mammographic evidence of malignancy. A result letter of this
screening mammogram will be mailed directly to the patient.

RECOMMENDATION:
Screening mammogram in one year. (Code:[id number])

BI-RADS CATEGORY  1: Negative.

## 2020-05-06 ENCOUNTER — Other Ambulatory Visit: Payer: Self-pay

## 2020-05-06 ENCOUNTER — Ambulatory Visit: Payer: Medicare HMO

## 2020-06-02 ENCOUNTER — Ambulatory Visit: Payer: Medicare HMO

## 2020-06-09 ENCOUNTER — Ambulatory Visit: Payer: Medicare HMO

## 2020-06-19 ENCOUNTER — Other Ambulatory Visit: Payer: Self-pay | Admitting: Obstetrics and Gynecology

## 2020-06-19 DIAGNOSIS — Z1231 Encounter for screening mammogram for malignant neoplasm of breast: Secondary | ICD-10-CM

## 2020-08-05 ENCOUNTER — Encounter: Payer: Medicare HMO | Admitting: Dermatology

## 2020-08-19 ENCOUNTER — Ambulatory Visit: Payer: Medicare HMO | Admitting: Dermatology

## 2020-08-19 ENCOUNTER — Other Ambulatory Visit: Payer: Self-pay

## 2020-08-19 DIAGNOSIS — L738 Other specified follicular disorders: Secondary | ICD-10-CM

## 2020-08-19 DIAGNOSIS — L578 Other skin changes due to chronic exposure to nonionizing radiation: Secondary | ICD-10-CM

## 2020-08-19 DIAGNOSIS — L814 Other melanin hyperpigmentation: Secondary | ICD-10-CM

## 2020-08-19 DIAGNOSIS — L720 Epidermal cyst: Secondary | ICD-10-CM | POA: Diagnosis not present

## 2020-08-19 DIAGNOSIS — L820 Inflamed seborrheic keratosis: Secondary | ICD-10-CM | POA: Diagnosis not present

## 2020-08-19 DIAGNOSIS — L570 Actinic keratosis: Secondary | ICD-10-CM

## 2020-08-19 NOTE — Patient Instructions (Signed)
Cryotherapy Aftercare  . Wash gently with soap and water everyday.   . Apply Vaseline and Band-Aid daily until healed.  

## 2020-08-19 NOTE — Progress Notes (Signed)
   Follow-Up Visit   Subjective  Rebecca Burton is a 70 y.o. female who presents for the following: Follow-up (Aks, patient still notices scaly spots on left cheek and nose. She didn't schedule for PDT because she was worried about pain during treatment.). She also has an itchy spot on her left posterior shoulder and left eyebrow.   The following portions of the chart were reviewed this encounter and updated as appropriate:       Review of Systems:  No other skin or systemic complaints except as noted in HPI or Assessment and Plan.  Objective  Well appearing patient in no apparent distress; mood and affect are within normal limits.  A focused examination was performed including face. Relevant physical exam findings are noted in the Assessment and Plan.  Objective  Left Malar Cheek x 2, upper sternum x 1 (3): Erythematous thin papules/macules with gritty scale.   Objective  Left Upper Eyebrow x 1, R medial shoulder x 1, L post shoulder x 1 (3): Pink brown waxy patch and scaly papules.  Objective  Right Nasal Dorsum: 2.11mm speckled dark brown macule  Images     Assessment & Plan  AK (actinic keratosis) (3) Left Malar Cheek x 2, upper sternum x 1  Destruction of lesion - Left Malar Cheek x 2, upper sternum x 1  Destruction method: cryotherapy   Informed consent: discussed and consent obtained   Lesion destroyed using liquid nitrogen: Yes   Region frozen until ice ball extended beyond lesion: Yes   Outcome: patient tolerated procedure well with no complications   Post-procedure details: wound care instructions given    Inflamed seborrheic keratosis (3) Left Upper Eyebrow x 1, R medial shoulder x 1, L post shoulder x 1  Destruction of lesion - Left Upper Eyebrow x 1, R medial shoulder x 1, L post shoulder x 1  Destruction method: cryotherapy   Informed consent: discussed and consent obtained   Lesion destroyed using liquid nitrogen: Yes   Region frozen until ice  ball extended beyond lesion: Yes   Outcome: patient tolerated procedure well with no complications   Post-procedure details: wound care instructions given    Lentigines Right Nasal Dorsum  Benign-appearing.  Observation.  Call clinic for new or changing moles.  Recommend daily use of broad spectrum spf 30+ sunscreen to sun-exposed areas.     Actinic Damage - chronic, secondary to cumulative UV radiation exposure/sun exposure over time - diffuse scaly erythematous macules with underlying dyspigmentation - Recommend daily broad spectrum sunscreen SPF 30+ to sun-exposed areas, reapply every 2 hours as needed.  - Call for new or changing lesions.  Sebaceous Hyperplasia/Hypertrophy - Small yellow firm papules on the left nasal dorsum, R nasal tip - Benign - Observe  Milia - tiny firm white papules - type of cyst - benign - may be extracted if symptomatic - observe   Return in about 6 months (around 02/16/2021) for TBSE.  IJamesetta Orleans, CMA, am acting as scribe for Brendolyn Patty, MD .  Documentation: I have reviewed the above documentation for accuracy and completeness, and I agree with the above.  Brendolyn Patty MD

## 2020-09-26 ENCOUNTER — Other Ambulatory Visit: Payer: Self-pay

## 2020-09-26 ENCOUNTER — Ambulatory Visit
Admission: RE | Admit: 2020-09-26 | Discharge: 2020-09-26 | Disposition: A | Discharge status: Home / Self Care | Payer: Medicare HMO | Source: Ambulatory Visit | Attending: Obstetrics and Gynecology | Admitting: Obstetrics and Gynecology

## 2020-09-26 DIAGNOSIS — Z1231 Encounter for screening mammogram for malignant neoplasm of breast: Secondary | ICD-10-CM | POA: Insufficient documentation

## 2021-01-23 ENCOUNTER — Encounter: Payer: Self-pay | Admitting: Internal Medicine

## 2021-01-26 ENCOUNTER — Encounter
Admission: RE | Disposition: A | Discharge status: Home / Self Care | Payer: Self-pay | Source: Home / Self Care | Attending: Internal Medicine

## 2021-01-26 ENCOUNTER — Ambulatory Visit
Admission: RE | Admit: 2021-01-26 | Discharge: 2021-01-26 | Disposition: A | Discharge status: Home / Self Care | Payer: Medicare HMO | Attending: Internal Medicine | Admitting: Internal Medicine

## 2021-01-26 ENCOUNTER — Ambulatory Visit: Payer: Medicare HMO | Admitting: Anesthesiology

## 2021-01-26 ENCOUNTER — Encounter: Payer: Self-pay | Admitting: Internal Medicine

## 2021-01-26 DIAGNOSIS — Z791 Long term (current) use of non-steroidal anti-inflammatories (NSAID): Secondary | ICD-10-CM | POA: Diagnosis not present

## 2021-01-26 DIAGNOSIS — K640 First degree hemorrhoids: Secondary | ICD-10-CM | POA: Insufficient documentation

## 2021-01-26 DIAGNOSIS — R1310 Dysphagia, unspecified: Secondary | ICD-10-CM | POA: Insufficient documentation

## 2021-01-26 DIAGNOSIS — K5730 Diverticulosis of large intestine without perforation or abscess without bleeding: Secondary | ICD-10-CM | POA: Insufficient documentation

## 2021-01-26 DIAGNOSIS — Z885 Allergy status to narcotic agent status: Secondary | ICD-10-CM | POA: Insufficient documentation

## 2021-01-26 DIAGNOSIS — Z888 Allergy status to other drugs, medicaments and biological substances status: Secondary | ICD-10-CM | POA: Insufficient documentation

## 2021-01-26 DIAGNOSIS — Z7901 Long term (current) use of anticoagulants: Secondary | ICD-10-CM | POA: Diagnosis not present

## 2021-01-26 DIAGNOSIS — I4891 Unspecified atrial fibrillation: Secondary | ICD-10-CM | POA: Insufficient documentation

## 2021-01-26 DIAGNOSIS — Z79899 Other long term (current) drug therapy: Secondary | ICD-10-CM | POA: Insufficient documentation

## 2021-01-26 DIAGNOSIS — K921 Melena: Secondary | ICD-10-CM | POA: Diagnosis present

## 2021-01-26 DIAGNOSIS — K219 Gastro-esophageal reflux disease without esophagitis: Secondary | ICD-10-CM | POA: Diagnosis not present

## 2021-01-26 DIAGNOSIS — K222 Esophageal obstruction: Secondary | ICD-10-CM | POA: Diagnosis not present

## 2021-01-26 DIAGNOSIS — K2970 Gastritis, unspecified, without bleeding: Secondary | ICD-10-CM | POA: Diagnosis not present

## 2021-01-26 DIAGNOSIS — Z882 Allergy status to sulfonamides status: Secondary | ICD-10-CM | POA: Insufficient documentation

## 2021-01-26 HISTORY — DX: Hyperlipidemia, unspecified: E78.5

## 2021-01-26 HISTORY — DX: Other specified disorders of bone density and structure, unspecified site: M85.80

## 2021-01-26 HISTORY — DX: Myalgia, unspecified site: M79.10

## 2021-01-26 HISTORY — DX: Unspecified atrial fibrillation: I48.91

## 2021-01-26 HISTORY — PX: ESOPHAGOGASTRODUODENOSCOPY: SHX5428

## 2021-01-26 HISTORY — DX: Anxiety disorder, unspecified: F41.9

## 2021-01-26 HISTORY — DX: Obesity, unspecified: E66.9

## 2021-01-26 HISTORY — PX: COLONOSCOPY WITH PROPOFOL: SHX5780

## 2021-01-26 SURGERY — EGD (ESOPHAGOGASTRODUODENOSCOPY)
Anesthesia: General

## 2021-01-26 MED ORDER — PROPOFOL 500 MG/50ML IV EMUL
INTRAVENOUS | Status: AC
  Filled 2021-01-26: qty 50

## 2021-01-26 MED ORDER — PROPOFOL 500 MG/50ML IV EMUL
INTRAVENOUS | Status: DC | PRN
  Administered 2021-01-26: 120 ug/kg/min via INTRAVENOUS

## 2021-01-26 MED ORDER — SODIUM CHLORIDE 0.9 % IV SOLN
INTRAVENOUS | Status: DC
  Administered 2021-01-26: 1000 mL via INTRAVENOUS

## 2021-01-26 MED ORDER — LIDOCAINE HCL (CARDIAC) PF 100 MG/5ML IV SOSY
PREFILLED_SYRINGE | INTRAVENOUS | Status: DC | PRN
  Administered 2021-01-26: 100 mg via INTRAVENOUS

## 2021-01-26 NOTE — H&P (Signed)
Outpatient short stay form Pre-procedure 01/26/2021 1:10 PM Paxton Kanaan K. Alice Reichert, M.D.  Primary Physician: Delight Stare, M.D.  Reason for visit:  GERD with suboptimal response to acid suppression, dysphagia, hemoccult positive stool.  History of present illness:  70 y/o with intermittent solid food dysphagia also c/o GERD incompletely controlled with Dexilant 60mg  po daily. No unexplained weight loss, melena, etc. Patient presents for colonoscopy for finding of a hemoccult positive stool. Patient denies change in bowel habits, rectal bleeding, weight loss or abdominal pain.   Patient on anticoagulation with Xarelto but has held this for a few days.   No current facility-administered medications for this encounter.  Current Outpatient Medications:    spironolactone (ALDACTONE) 25 MG tablet, Take 25 mg by mouth daily., Disp: , Rfl:    traZODone (DESYREL) 50 MG tablet, Take 50 mg by mouth at bedtime., Disp: , Rfl:    acetaminophen (TYLENOL) 500 MG tablet, Take 500 mg by mouth every 6 (six) hours as needed (for headache.)., Disp: , Rfl:    Calcium Carb-Cholecalciferol 600-800 MG-UNIT TABS, Take 1 tablet by mouth at bedtime., Disp: , Rfl:    dexlansoprazole (DEXILANT) 60 MG capsule, Take 60 mg by mouth daily before breakfast. , Disp: , Rfl:    DILT-XR 240 MG 24 hr capsule, Take 240 mg by mouth daily., Disp: , Rfl:    fexofenadine (ALLEGRA) 180 MG tablet, Take 180 mg by mouth daily as needed for allergies.  (Patient not taking: Reported on 08/19/2020), Disp: , Rfl:    fluticasone (FLONASE) 50 MCG/ACT nasal spray, Place 2 sprays into the nose daily as needed for allergies.  (Patient not taking: Reported on 08/19/2020), Disp: , Rfl:    hydrochlorothiazide (HYDRODIURIL) 25 MG tablet, Take 25 mg by mouth daily., Disp: , Rfl:    ibuprofen (ADVIL,MOTRIN) 800 MG tablet, Take 800 mg by mouth every 8 (eight) hours as needed (for pain.).  (Patient not taking: Reported on 08/19/2020), Disp: , Rfl:    losartan (COZAAR)  100 MG tablet, Take 100 mg by mouth daily. , Disp: , Rfl:    Omega-3 Fatty Acids (FISH OIL) 1200 MG CAPS, Take 1,200 mg by mouth daily. (Patient not taking: Reported on 08/19/2020), Disp: , Rfl:    rivaroxaban (XARELTO) 20 MG TABS tablet, Take by mouth., Disp: , Rfl:   No medications prior to admission.     Allergies  Allergen Reactions   Enalapril Swelling    Critical   Hydrocodone Itching   Macrolides And Ketolides Nausea And Vomiting    Yeast infection   Sulfa Antibiotics    Naproxen Sodium Nausea And Vomiting    Pt does not think she has a problem with this medication     Past Medical History:  Diagnosis Date   A-fib ([place])    Acid reflux    Actinic keratosis    Anxiety    Cervical lesion 09/12/2015   Diverticulitis 11/01/2016   GERD (gastroesophageal reflux disease) 11/01/2016   HLD (hyperlipidemia)    HTN (hypertension) 11/01/2016   Hypertension    Myalgia    Obesity    Osteopenia    Pelvic pain in female 11/01/2016    Review of systems:  Otherwise negative.    Physical Exam  Gen: Alert, oriented. Appears stated age.  HEENT: [place]/AT. PERRLA. Lungs: CTA, no wheezes. CV: RR nl S1, S2. Abd: soft, benign, no masses. BS+ Ext: No edema. Pulses 2+    Planned procedures: Proceed with colonoscopy. The patient understands the nature of the  planned procedure, indications, risks, alternatives and potential complications including but not limited to bleeding, infection, perforation, damage to internal organs and possible oversedation/side effects from anesthesia. The patient agrees and gives consent to proceed.  Please refer to procedure notes for findings, recommendations and patient disposition/instructions.     Rilan Eiland K. Alice Reichert, M.D. Gastroenterology 01/26/2021  1:10 PM

## 2021-01-26 NOTE — Anesthesia Preprocedure Evaluation (Signed)
Anesthesia Evaluation  Patient identified by MRN, date of birth, ID band Patient awake    Reviewed: Allergy & Precautions, NPO status , Patient's Chart, lab work & pertinent test results, reviewed documented beta blocker date and time   Airway Mallampati: III  TM Distance: >3 FB     Dental  (+) Chipped   Pulmonary    Pulmonary exam normal        Cardiovascular hypertension, Pt. on medications + dysrhythmias Atrial Fibrillation  Rhythm:Irregular Rate:Abnormal     Neuro/Psych Anxiety    GI/Hepatic GERD  Medicated,  Endo/Other  Morbid obesity  Renal/GU      Musculoskeletal   Abdominal (+) + obese,   Peds  Hematology   Anesthesia Other Findings . A-fib (CMS-HCC)  . Acrochordon  . Allergic rhinitis  . Anxiety  . Diverticulitis  . GERD (gastroesophageal reflux disease)  . HTN (hypertension)  . Hyperlipidemia  . Myalgia  . Obesity  . Osteopenia   Reproductive/Obstetrics                             Anesthesia Physical  Anesthesia Plan  ASA: 3  Anesthesia Plan: General   Post-op Pain Management:    Induction: Intravenous  PONV Risk Score and Plan: TIVA and Propofol infusion  Airway Management Planned: Natural Airway and Nasal Cannula  Additional Equipment:   Intra-op Plan:   Post-operative Plan:   Informed Consent: I have reviewed the patients History and Physical, chart, labs and discussed the procedure including the risks, benefits and alternatives for the proposed anesthesia with the patient or authorized representative who has indicated his/her understanding and acceptance.       Plan Discussed with: CRNA, Anesthesiologist and Surgeon  Anesthesia Plan Comments:         Anesthesia Quick Evaluation

## 2021-01-26 NOTE — Transfer of Care (Signed)
Immediate Anesthesia Transfer of Care Note  Patient: Rebecca Burton  Procedure(s) Performed: ESOPHAGOGASTRODUODENOSCOPY (EGD) COLONOSCOPY WITH PROPOFOL  Patient Location: PACU  Anesthesia Type:General  Level of Consciousness: awake and sedated  Airway & Oxygen Therapy: Patient Spontanous Breathing, Patient connected to nasal cannula oxygen and Patient connected to face mask oxygen  Post-op Assessment: Report given to RN and Post -op Vital signs reviewed and stable  Post vital signs: Reviewed and stable  Last Vitals:  Vitals Value Taken Time  BP    Temp    Pulse    Resp    SpO2      Last Pain:  Vitals:   01/26/21 1423  TempSrc: Temporal  PainSc: 0-No pain         Complications: No notable events documented.

## 2021-01-26 NOTE — Interval H&P Note (Signed)
History and Physical Interval Note:  01/26/2021 2:06 PM  Rebecca Burton  has presented today for surgery, with the diagnosis of +POSITIVE STOOL,GERD.  The various methods of treatment have been discussed with the patient and family. After consideration of risks, benefits and other options for treatment, the patient has consented to  Procedure(s): ESOPHAGOGASTRODUODENOSCOPY (EGD) (N/A) COLONOSCOPY WITH PROPOFOL (N/A) as a surgical intervention.  The patient's history has been reviewed, patient examined, no change in status, stable for surgery.  I have reviewed the patient's chart and labs.  Questions were answered to the patient's satisfaction.     [place], [place]

## 2021-01-26 NOTE — Anesthesia Procedure Notes (Signed)
Date/Time: 01/26/2021 3:12 PM Performed by: Vaughan Sine Pre-anesthesia Checklist: Patient identified, Emergency Drugs available, Suction available, Patient being monitored and Timeout performed Patient Re-evaluated:Patient Re-evaluated prior to induction Oxygen Delivery Method: Supernova nasal CPAP Preoxygenation: Pre-oxygenation with 100% oxygen Induction Type: IV induction Airway Equipment and Method: Bite block Placement Confirmation: positive ETCO2 and CO2 detector

## 2021-01-26 NOTE — Anesthesia Postprocedure Evaluation (Signed)
Anesthesia Post Note  Patient: Rebecca Burton  Procedure(s) Performed: ESOPHAGOGASTRODUODENOSCOPY (EGD) COLONOSCOPY WITH PROPOFOL  Patient location during evaluation: Phase II Anesthesia Type: General and Combined Spinal/Epidural Level of consciousness: awake and alert, awake and oriented Pain management: pain level controlled Vital Signs Assessment: post-procedure vital signs reviewed and stable Respiratory status: spontaneous breathing, nonlabored ventilation and respiratory function stable Cardiovascular status: blood pressure returned to baseline and stable Postop Assessment: no apparent nausea or vomiting Anesthetic complications: no   No notable events documented.   Last Vitals:  Vitals:   01/26/21 1537 01/26/21 1547  BP: (!) 143/86 126/64  Pulse: 98 99  Resp: 16 (!) 22  Temp:    SpO2: 96% 95%    Last Pain:  Vitals:   01/26/21 1547  TempSrc:   PainSc: 0-No pain                 Phill Mutter

## 2021-01-26 NOTE — Op Note (Signed)
[facility] Gastroenterology Patient Name: Rebecca Burton Procedure Date: 01/26/2021 2:54 PM MRN: [id number] Account #: [id number] Date of Birth: 07-14-1951 Admit Type: Outpatient Age: 70 Room: [facility] ENDO ROOM 3 Gender: Female Note Status: Finalized Procedure:             Colonoscopy Indications:           Heme positive stool Providers:             Benay Pike. Britany Callicott MD, MD Medicines:             Propofol per Anesthesia Complications:         No immediate complications. Procedure:             Pre-Anesthesia Assessment:                        - The risks and benefits of the procedure and the                         sedation options and risks were discussed with the                         patient. All questions were answered and informed                         consent was obtained.                        - Patient identification and proposed procedure were                         verified prior to the procedure by the nurse. The                         procedure was verified in the procedure room.                        - ASA Grade Assessment: III - A patient with severe                         systemic disease.                        - After reviewing the risks and benefits, the patient                         was deemed in satisfactory condition to undergo the                         procedure.                        After obtaining informed consent, the colonoscope was                         passed under direct vision. Throughout the procedure,                         the patient's blood pressure, pulse, and oxygen  saturations were monitored continuously. The                         Colonoscope was introduced through the anus and                         advanced to the the cecum, identified by appendiceal                         orifice and ileocecal valve. The colonoscopy was                         performed without difficulty. The  patient tolerated                         the procedure well. The quality of the bowel                         preparation was adequate. The ileocecal valve,                         appendiceal orifice, and rectum were photographed. Findings:      The perianal and digital rectal examinations were normal. Pertinent       negatives include normal sphincter tone and no palpable rectal lesions.      Non-bleeding internal hemorrhoids were found during retroflexion. The       hemorrhoids were Grade I (internal hemorrhoids that do not prolapse).      Multiple small and large-mouthed diverticula were found in the entire       colon. There was no evidence of diverticular bleeding.      The exam was otherwise without abnormality. Impression:            - Non-bleeding internal hemorrhoids.                        - Moderate diverticulosis in the entire examined                         colon. There was no evidence of diverticular bleeding.                        - The examination was otherwise normal.                        - No specimens collected. Recommendation:        - Monitor results to esophageal dilation                        - Patient has a contact number available for                         emergencies. The signs and symptoms of potential                         delayed complications were discussed with the patient.                         Return to normal activities tomorrow. Written  discharge instructions were provided to the patient.                        - Resume previous diet.                        - Continue present medications.                        - No repeat colonoscopy due to current age (8 years                         or older) and the absence of colonic polyps.                        - You do NOT require further colon cancer screening                         measures (Annual stool testing (i.e. hemoccult, FIT,                         cologuard),  sigmoidoscopy, colonoscopy or CT                         colonography). You should share this recommendation                         with your Primary Care provider.                        - Resume Xarelto (rivaroxaban) at prior dose today.                         Refer to managing physician for further adjustment of                         therapy.                        - Return to physician assistant in 3 months.                        - The findings and recommendations were discussed with                         the patient. Procedure Code(s):     --- Professional ---                        [phone], Colonoscopy, flexible; diagnostic, including                         collection of specimen(s) by brushing or washing, when                         performed (separate procedure) Diagnosis Code(s):     --- Professional ---                        K57.30, Diverticulosis of large intestine without  perforation or abscess without bleeding                        R19.5, Other fecal abnormalities                        K64.0, First degree hemorrhoids CPT copyright 2019 American Medical Association. All rights reserved. The codes documented in this report are preliminary and upon coder review may  be revised to meet current compliance requirements. Efrain Sella MD, MD 01/26/2021 3:23:44 PM This report has been signed electronically. Number of Addenda: 0 Note Initiated On: 01/26/2021 2:54 PM Scope Withdrawal Time: 0 hours 6 minutes 35 seconds  Total Procedure Duration: 0 hours 9 minutes 16 seconds  Estimated Blood Loss:  Estimated blood loss: none.      [facility]

## 2021-01-26 NOTE — Op Note (Signed)
[facility] Gastroenterology Patient Name: Rebecca Burton Procedure Date: 01/26/2021 2:54 PM MRN: [id number] Account #: [id number] Date of Birth: 1951-07-04 Admit Type: Outpatient Age: 70 Room: [facility] ENDO ROOM 3 Gender: Female Note Status: Finalized Procedure:             Upper GI endoscopy Indications:           Dysphagia, Suspected gastro-esophageal reflux disease Providers:             Benay Pike. Danira Nylander MD, MD Medicines:             Propofol per Anesthesia Complications:         No immediate complications. Estimated blood loss: None. Procedure:             Pre-Anesthesia Assessment:                        - The risks and benefits of the procedure and the                         sedation options and risks were discussed with the                         patient. All questions were answered and informed                         consent was obtained.                        - Patient identification and proposed procedure were                         verified prior to the procedure by the nurse. The                         procedure was verified in the procedure room.                        - ASA Grade Assessment: III - A patient with severe                         systemic disease.                        - After reviewing the risks and benefits, the patient                         was deemed in satisfactory condition to undergo the                         procedure.                        After obtaining informed consent, the endoscope was                         passed under direct vision. Throughout the procedure,                         the patient's blood pressure, pulse, and oxygen  saturations were monitored continuously. The Endoscope                         was introduced through the mouth, and advanced to the                         third part of duodenum. The upper GI endoscopy was                         accomplished without difficulty.  The patient tolerated                         the procedure well. Findings:      Normal mucosa was found in the entire esophagus.      One benign-appearing, intrinsic mild stenosis was found at the       gastroesophageal junction. This stenosis measured 1.5 cm (inner       diameter) x less than one cm (in length). The stenosis was traversed.       The scope was withdrawn. Dilation was performed with a Maloney dilator       with no resistance at 75 Fr.      Diffuse mild inflammation characterized by erythema was found in the       entire examined stomach.      The examined duodenum was normal. Impression:            - Normal mucosa was found in the entire esophagus.                        - Benign-appearing esophageal stenosis. Dilated.                        - Gastritis.                        - Normal examined duodenum.                        - No specimens collected. Recommendation:        - Monitor results to esophageal dilation                        - Proceed with colonoscopy Procedure Code(s):     --- Professional ---                        [phone], Esophagogastroduodenoscopy, flexible,                         transoral; diagnostic, including collection of                         specimen(s) by brushing or washing, when performed                         (separate procedure)                        [id number], Dilation of esophagus, by unguided sound or                         bougie, single  or multiple passes Diagnosis Code(s):     --- Professional ---                        R13.10, Dysphagia, unspecified                        K29.70, Gastritis, unspecified, without bleeding                        K22.2, Esophageal obstruction CPT copyright 2019 American Medical Association. All rights reserved. The codes documented in this report are preliminary and upon coder review may  be revised to meet current compliance requirements. Efrain Sella MD, MD 01/26/2021 3:08:40 PM This report  has been signed electronically. Number of Addenda: 0 Note Initiated On: 01/26/2021 2:54 PM Estimated Blood Loss:  Estimated blood loss: none.      [facility]

## 2021-01-27 ENCOUNTER — Encounter: Payer: Self-pay | Admitting: Internal Medicine

## 2021-02-24 ENCOUNTER — Encounter: Payer: Medicare HMO | Admitting: Dermatology

## 2021-05-19 ENCOUNTER — Other Ambulatory Visit: Payer: Self-pay

## 2021-05-19 ENCOUNTER — Ambulatory Visit: Payer: Medicare HMO | Admitting: Dermatology

## 2021-05-19 DIAGNOSIS — L821 Other seborrheic keratosis: Secondary | ICD-10-CM

## 2021-05-19 DIAGNOSIS — L820 Inflamed seborrheic keratosis: Secondary | ICD-10-CM | POA: Diagnosis not present

## 2021-05-19 DIAGNOSIS — I8393 Asymptomatic varicose veins of bilateral lower extremities: Secondary | ICD-10-CM | POA: Diagnosis not present

## 2021-05-19 DIAGNOSIS — L570 Actinic keratosis: Secondary | ICD-10-CM

## 2021-05-19 DIAGNOSIS — Z1283 Encounter for screening for malignant neoplasm of skin: Secondary | ICD-10-CM | POA: Diagnosis not present

## 2021-05-19 DIAGNOSIS — L738 Other specified follicular disorders: Secondary | ICD-10-CM

## 2021-05-19 DIAGNOSIS — L814 Other melanin hyperpigmentation: Secondary | ICD-10-CM

## 2021-05-19 DIAGNOSIS — L578 Other skin changes due to chronic exposure to nonionizing radiation: Secondary | ICD-10-CM

## 2021-05-19 DIAGNOSIS — D229 Melanocytic nevi, unspecified: Secondary | ICD-10-CM

## 2021-05-19 DIAGNOSIS — D1800 Hemangioma unspecified site: Secondary | ICD-10-CM

## 2021-05-19 NOTE — Patient Instructions (Addendum)
Actinic keratoses are precancerous spots that appear secondary to cumulative UV radiation exposure/sun exposure over time. They are chronic with expected duration over 1 year. A portion of actinic keratoses will progress to squamous cell carcinoma of the skin. It is not possible to reliably predict which spots will progress to skin cancer and so treatment is recommended to prevent development of skin cancer.  Recommend daily broad spectrum sunscreen SPF 30+ to sun-exposed areas, reapply every 2 hours as needed.  Recommend staying in the shade or wearing long sleeves, sun glasses (UVA+UVB protection) and wide brim hats (4-inch brim around the entire circumference of the hat). Call for new or changing lesions.   Cryotherapy Aftercare  Wash gently with soap and water everyday.   Apply Vaseline and Band-Aid daily until healed.   Seborrheic Keratosis  What causes seborrheic keratoses? Seborrheic keratoses are harmless, common skin growths that first appear during adult life.  As time goes by, more growths appear.  Some people may develop a large number of them.  Seborrheic keratoses appear on both covered and uncovered body parts.  They are not caused by sunlight.  The tendency to develop seborrheic keratoses can be inherited.  They vary in color from skin-colored to gray, brown, or even black.  They can be either smooth or have a rough, warty surface.   Seborrheic keratoses are superficial and look as if they were stuck on the skin.  Under the microscope this type of keratosis looks like layers upon layers of skin.  That is why at times the top layer may seem to fall off, but the rest of the growth remains and re-grows.    Treatment Seborrheic keratoses do not need to be treated, but can easily be removed in the office.  Seborrheic keratoses often cause symptoms when they rub on clothing or jewelry.  Lesions can be in the way of shaving.  If they become inflamed, they can cause itching, soreness, or  burning.  Removal of a seborrheic keratosis can be accomplished by freezing, burning, or surgery. If any spot bleeds, scabs, or grows rapidly, please return to have it checked, as these can be an indication of a skin cancer.    Melanoma ABCDEs  Melanoma is the most dangerous type of skin cancer, and is the leading cause of death from skin disease.  You are more likely to develop melanoma if you: Have light-colored skin, light-colored eyes, or red or blond hair Spend a lot of time in the sun Tan regularly, either outdoors or in a tanning bed Have had blistering sunburns, especially during childhood Have a close family member who has had a melanoma Have atypical moles or large birthmarks  Early detection of melanoma is key since treatment is typically straightforward and cure rates are extremely high if we catch it early.   The first sign of melanoma is often a change in a mole or a new dark spot.  The ABCDE system is a way of remembering the signs of melanoma.  A for asymmetry:  The two halves do not match. B for border:  The edges of the growth are irregular. C for color:  A mixture of colors are present instead of an even brown color. D for diameter:  Melanomas are usually (but not always) greater than 71mm - the size of a pencil eraser. E for evolution:  The spot keeps changing in size, shape, and color.  Please check your skin once per month between visits. You can use a  small mirror in front and a large mirror behind you to keep an eye on the back side or your body.   If you see any new or changing lesions before your next follow-up, please call to schedule a visit.  Please continue daily skin protection including broad spectrum sunscreen SPF 30+ to sun-exposed areas, reapplying every 2 hours as needed when you're outdoors.   Staying in the shade or wearing long sleeves, sun glasses (UVA+UVB protection) and wide brim hats (4-inch brim around the entire circumference of the hat) are  also recommended for sun protection.    If you have any questions or concerns for your doctor, please call our main line at [phone] and press option 4 to reach your doctor's medical assistant. If no one answers, please leave a voicemail as directed and we will return your call as soon as possible. Messages left after 4 pm will be answered the following business day.   You may also send [place] a message via [place]. We typically respond to MyChart messages within 1-2 business days.  For prescription refills, please ask your pharmacy to contact our office. Our fax number is [phone].  If you have an urgent issue when the clinic is closed that cannot wait until the next business day, you can page your doctor at the number below.    Please note that while we do our best to be available for urgent issues outside of office hours, we are not available 24/7.   If you have an urgent issue and are unable to reach [place], you may choose to seek medical care at your doctor's office, retail clinic, urgent care center, or emergency room.  If you have a medical emergency, please immediately call 911 or go to the emergency department.  Pager Numbers  - Dr. Nehemiah Massed: [phone]  - Dr. Laurence Ferrari: [phone]  - Dr. Nicole Kindred: [phone]  In the event of inclement weather, please call our main line at [phone] for an update on the status of any delays or closures.  Dermatology Medication Tips: Please keep the boxes that topical medications come in in order to help keep track of the instructions about where and how to use these. Pharmacies typically print the medication instructions only on the boxes and not directly on the medication tubes.   If your medication is too expensive, please contact our office at [phone] option 4 or send [place] a message through [place].   We are unable to tell what your co-pay for medications will be in advance as this is different depending on your insurance coverage.  However, we may be able to find a substitute medication at lower cost or fill out paperwork to get insurance to cover a needed medication.   If a prior authorization is required to get your medication covered by your insurance company, please allow [place] 1-2 business days to complete this process.  Drug prices often vary depending on where the prescription is filled and some pharmacies may offer cheaper prices.  The website www.goodrx.com contains coupons for medications through different pharmacies. The prices here do not account for what the cost may be with help from insurance (it may be cheaper with your insurance), but the website can give you the price if you did not use any insurance.  - You can print the associated coupon and take it with your prescription to the pharmacy.  - You may also stop by our office during regular business hours and pick up a GoodRx coupon card.  -  If you need your prescription sent electronically to a different pharmacy, notify our office through [facility] MyChart or by phone at [phone] option 4.  

## 2021-05-19 NOTE — Progress Notes (Signed)
Follow-Up Visit   Subjective  Rebecca Burton is a 70 y.o. female who presents for the following: Follow-up (Patient here today for 6 month tbse. Patient has a rough scaly spot at right upper arm that itches. Patient reports some scaly spots at face she would like checked. Patient also would like a dark spot at nose checked. Others keep telling her to get it checked.  Might be darker.).  Patient here for full body skin exam and skin cancer screening.  The following portions of the chart were reviewed this encounter and updated as appropriate:      Review of Systems: No other skin or systemic complaints except as noted in HPI or Assessment and Plan.   Objective  Well appearing patient in no apparent distress; mood and affect are within normal limits.  A full examination was performed including scalp, head, eyes, ears, nose, lips, neck, chest, axillae, abdomen, back, buttocks, bilateral upper extremities, bilateral lower extremities, hands, feet, fingers, toes, fingernails, and toenails. All findings within normal limits unless otherwise noted below.  Left Malar Cheek x 3, left nasal dorsum x 1, left ear helix x 1 (5) Erythematous thin papules/macules with gritty scale.   right nasal dorsum x 1 right mid forearm x 1, right upper elbow x 1, (3) Erythematous keratotic or waxy stuck-on papule or plaque (r upper elbow).   right anterior thigh 1.5 x 1 cm waxy tan patch with irregular border        Right Tip of Nose 5 mm indistinct yellow firm papule No change when compared to photo   Assessment & Plan  Actinic keratosis (5) Left Malar Cheek x 3, left nasal dorsum x 1, left ear helix x 1  Actinic keratoses are precancerous spots that appear secondary to cumulative UV radiation exposure/sun exposure over time. They are chronic with expected duration over 1 year. A portion of actinic keratoses will progress to squamous cell carcinoma of the skin. It is not possible to reliably  predict which spots will progress to skin cancer and so treatment is recommended to prevent development of skin cancer.  Recommend daily broad spectrum sunscreen SPF 30+ to sun-exposed areas, reapply every 2 hours as needed.  Recommend staying in the shade or wearing long sleeves, sun glasses (UVA+UVB protection) and wide brim hats (4-inch brim around the entire circumference of the hat). Call for new or changing lesions.  Destruction of lesion - Left Malar Cheek x 3, left nasal dorsum x 1, left ear helix x 1  Destruction method: cryotherapy   Informed consent: discussed and consent obtained   Lesion destroyed using liquid nitrogen: Yes   Region frozen until ice ball extended beyond lesion: Yes   Outcome: patient tolerated procedure well with no complications   Post-procedure details: wound care instructions given   Additional details:  Prior to procedure, discussed risks of blister formation, small wound, skin dyspigmentation, or rare scar following cryotherapy. Recommend Vaseline ointment to treated areas while healing.   Inflamed seborrheic keratosis right nasal dorsum x 1 right mid forearm x 1, right upper elbow x 1,  Isk vs irritated lentigo at right nasal dorsum will recheck at next follow up.   Destruction of lesion - right nasal dorsum x 1 right mid forearm x 1, right upper elbow x 1,  Destruction method: cryotherapy   Informed consent: discussed and consent obtained   Lesion destroyed using liquid nitrogen: Yes   Region frozen until ice ball extended beyond lesion: Yes  Outcome: patient tolerated procedure well with no complications   Post-procedure details: wound care instructions given   Additional details:  Prior to procedure, discussed risks of blister formation, small wound, skin dyspigmentation, or rare scar following cryotherapy. Recommend Vaseline ointment to treated areas while healing.   Seborrheic keratosis right anterior thigh  Reassured benign age-related  growth.  Recommend observation.  Discussed cryotherapy if spot(s) become irritated or inflamed.    Sebaceous hyperplasia of face Right Tip of Nose  Sebaceous hypertrophy Benign-appearing. Stable. Observe    Lentigines - Scattered tan macules - Due to sun exposure - Benign-appearing, observe - Recommend daily broad spectrum sunscreen SPF 30+ to sun-exposed areas, reapply every 2 hours as needed. - Call for any changes  Seborrheic Keratoses - Stuck-on, waxy, tan-brown papules and/or plaques  - Benign-appearing - Discussed benign etiology and prognosis. - Observe - Call for any changes  Varicose Veins/Spider Veins - Dilated blue, purple or red veins at the lower extremities - Reassured - Smaller vessels can be treated by sclerotherapy (a procedure to inject a medicine into the veins to make them disappear) if desired, but the treatment is not covered by insurance. Larger vessels may be covered if symptomatic and we would refer to vascular surgeon if treatment desired.  Melanocytic Nevi - Tan-brown and/or pink-flesh-colored symmetric macules and papules - Benign appearing on exam today - Observation - Call clinic for new or changing moles - Recommend daily use of broad spectrum spf 30+ sunscreen to sun-exposed areas.   Hemangiomas - Red papules - Discussed benign nature - Observe - Call for any changes  Actinic Damage - Severe, confluent actinic changes with pre-cancerous actinic keratoses  - Severe, chronic, not at goal, secondary to cumulative UV radiation exposure over time - diffuse scaly erythematous macules and papules with underlying dyspigmentation - Discussed Prescription "Field Treatment" for Severe, Chronic Confluent Actinic Changes with Pre-Cancerous Actinic Keratoses Field treatment involves treatment of an entire area of skin that has confluent Actinic Changes (Sun/ Ultraviolet light damage) and PreCancerous Actinic Keratoses by method of PhotoDynamic Therapy  (PDT) and/or prescription Topical Chemotherapy agents such as 5-fluorouracil, 5-fluorouracil/calcipotriene, and/or imiquimod.  The purpose is to decrease the number of clinically evident and subclinical PreCancerous lesions to prevent progression to development of skin cancer by chemically destroying early precancer changes that may or may not be visible.  It has been shown to reduce the risk of developing skin cancer in the treated area. As a result of treatment, redness, scaling, crusting, and open sores may occur during treatment course. One or more than one of these methods may be used and may have to be used several times to control, suppress and eliminate the PreCancerous changes. Discussed treatment course, expected reaction, and possible side effects. - Recommend daily broad spectrum sunscreen SPF 30+ to sun-exposed areas, reapply every 2 hours as needed.  - Staying in the shade or wearing long sleeves, sun glasses (UVA+UVB protection) and wide brim hats (4-inch brim around the entire circumference of the hat) are also recommended. - Call for new or changing lesions. Recommend PDT to the face x 2, 1 mo apart. Procedure explained to patient in office. Pt may schedule  Skin cancer screening performed today.  Return for schedule pdt for face , 6 month ak and isk. I, Ruthell Rummage, CMA, am acting as scribe for Brendolyn Patty, MD.  Documentation: I have reviewed the above documentation for accuracy and completeness, and I agree with the above.  Brendolyn Patty MD

## 2021-08-26 ENCOUNTER — Other Ambulatory Visit: Payer: Self-pay | Admitting: Family Medicine

## 2021-08-26 DIAGNOSIS — Z1231 Encounter for screening mammogram for malignant neoplasm of breast: Secondary | ICD-10-CM

## 2021-10-02 ENCOUNTER — Other Ambulatory Visit: Payer: Self-pay

## 2021-10-02 ENCOUNTER — Ambulatory Visit
Admission: RE | Admit: 2021-10-02 | Discharge: 2021-10-02 | Disposition: A | Discharge status: Home / Self Care | Payer: Medicare HMO | Source: Ambulatory Visit | Attending: Family Medicine | Admitting: Family Medicine

## 2021-10-02 DIAGNOSIS — Z1231 Encounter for screening mammogram for malignant neoplasm of breast: Secondary | ICD-10-CM | POA: Diagnosis present

## 2021-11-24 ENCOUNTER — Ambulatory Visit: Payer: Medicare HMO | Admitting: Dermatology

## 2021-11-24 DIAGNOSIS — R202 Paresthesia of skin: Secondary | ICD-10-CM

## 2021-11-24 DIAGNOSIS — L578 Other skin changes due to chronic exposure to nonionizing radiation: Secondary | ICD-10-CM

## 2021-11-24 DIAGNOSIS — L570 Actinic keratosis: Secondary | ICD-10-CM

## 2021-11-24 MED ORDER — FLUOROURACIL 5 % EX CREA: TOPICAL_CREAM | Freq: Two times a day (BID) | CUTANEOUS | 1 refills | Status: DC

## 2021-11-24 NOTE — Patient Instructions (Addendum)
?Wait to use cream when places at face heal up. ? ?Start 5-fluorouracil/calcipotriene cream twice a day for 4 - 7 days to affected areas including forehead, b/l eyebrows, b/l temples, b/l cheeks , nose. Prescription sent to [facility]. Patient provided with contact information for pharmacy and advised the pharmacy will mail the prescription to their home. Patient provided with handout reviewing treatment course and side effects and advised to call or message [place] on MyChart with any concerns. ? ? ?5-Fluorouracil/Calcipotriene Patient Education  ? ?Actinic keratoses are the dry, red scaly spots on the skin caused by sun damage. A portion of these spots can turn into skin cancer with time, and treating them can help prevent development of skin cancer.  ? ?Treatment of these spots requires removal of the defective skin cells. There are various ways to remove actinic keratoses, including freezing with liquid nitrogen, treatment with creams, or treatment with a blue light procedure in the office.  ? ?5-fluorouracil cream is a topical cream used to treat actinic keratoses. It works by interfering with the growth of abnormal fast-growing skin cells, such as actinic keratoses. These cells peel off and are replaced by healthy ones.  ? ?5-fluorouracil/calcipotriene is a combination of the 5-fluorouracil cream with a vitamin D analog cream called calcipotriene. The calcipotriene alone does not treat actinic keratoses. However, when it is combined with 5-fluorouracil, it helps the 5-fluorouracil treat the actinic keratoses much faster so that the same results can be achieved with a much shorter treatment time. ? ?INSTRUCTIONS FOR 5-FLUOROURACIL/CALCIPOTRIENE CREAM:  ? ?5-fluorouracil/calcipotriene cream typically only needs to be used for 4-7 days. A thin layer should be applied twice a day to the treatment areas recommended by your physician.  ? ?If your physician prescribed you separate tubes of 5-fluourouracil and  calcipotriene, apply a thin layer of 5-fluorouracil followed by a thin layer of calcipotriene.  ? ?Avoid contact with your eyes, nostrils, and mouth. Do not use 5-fluorouracil/calcipotriene cream on infected or open wounds.  ? ?You will develop redness, irritation and some crusting at areas where you have pre-cancer damage/actinic keratoses. IF YOU DEVELOP PAIN, BLEEDING, OR SIGNIFICANT CRUSTING, STOP THE TREATMENT EARLY - you have already gotten a good response and the actinic keratoses should clear up well. ? ?Wash your hands after applying 5-fluorouracil 5% cream on your skin.  ? ?A moisturizer or sunscreen with a minimum SPF 30 should be applied each morning.  ? ?Once you have finished the treatment, you can apply a thin layer of Vaseline twice a day to irritated areas to soothe and calm the areas more quickly. If you experience significant discomfort, contact your physician. ? ?For some patients it is necessary to repeat the treatment for best results. ? ?SIDE EFFECTS: When using 5-fluorouracil/calcipotriene cream, you may have mild irritation, such as redness, dryness, swelling, or a mild burning sensation. This usually resolves within 2 weeks. The more actinic keratoses you have, the more redness and inflammation you can expect during treatment. Eye irritation has been reported rarely. If this occurs, please let us know.  ?If you have any trouble using this cream, please call the office. If you have any other questions about this information, please do not hesitate to ask me before you leave the office. ? ? ? ?Actinic keratoses are precancerous spots that appear secondary to cumulative UV radiation exposure/sun exposure over time. They are chronic with expected duration over 1 year. A portion of actinic keratoses will progress to squamous cell carcinoma of the skin.  It is not possible to reliably predict which spots will progress to skin cancer and so treatment is recommended to prevent development of skin  cancer. ? ?Recommend daily broad spectrum sunscreen SPF 30+ to sun-exposed areas, reapply every 2 hours as needed.  ?Recommend staying in the shade or wearing long sleeves, sun glasses (UVA+UVB protection) and wide brim hats (4-inch brim around the entire circumference of the hat). ?Call for new or changing lesions.  ? ?Cryotherapy Aftercare ? ?Wash gently with soap and water everyday.   ?Apply Vaseline and Band-Aid daily until healed.  ? ?Seborrheic Keratosis ? ?What causes seborrheic keratoses? ?Seborrheic keratoses are harmless, common skin growths that first appear during adult life.  As time goes by, more growths appear.  Some people may develop a large number of them.  Seborrheic keratoses appear on both covered and uncovered body parts.  They are not caused by sunlight.  The tendency to develop seborrheic keratoses can be inherited.  They vary in color from skin-colored to gray, brown, or even black.  They can be either smooth or have a rough, warty surface.   ?Seborrheic keratoses are superficial and look as if they were stuck on the skin.  Under the microscope this type of keratosis looks like layers upon layers of skin.  That is why at times the top layer may seem to fall off, but the rest of the growth remains and re-grows.   ? ?Treatment ?Seborrheic keratoses do not need to be treated, but can easily be removed in the office.  Seborrheic keratoses often cause symptoms when they rub on clothing or jewelry.  Lesions can be in the way of shaving.  If they become inflamed, they can cause itching, soreness, or burning.  Removal of a seborrheic keratosis can be accomplished by freezing, burning, or surgery. ?If any spot bleeds, scabs, or grows rapidly, please return to have it checked, as these can be an indication of a skin cancer. ? ?If You Need Anything After Your Visit ? ?If you have any questions or concerns for your doctor, please call our main line at [phone] and press option 4 to reach your  doctor's medical assistant. If no one answers, please leave a voicemail as directed and we will return your call as soon as possible. Messages left after 4 pm will be answered the following business day.  ? ?You may also send [place] a message via MyChart. We typically respond to MyChart messages within 1-2 business days. ? ?For prescription refills, please ask your pharmacy to contact our office. Our fax number is [phone]. ? ?If you have an urgent issue when the clinic is closed that cannot wait until the next business day, you can page your doctor at the number below.   ? ?Please note that while we do our best to be available for urgent issues outside of office hours, we are not available 24/7.  ? ?If you have an urgent issue and are unable to reach [place], you may choose to seek medical care at your doctor's office, retail clinic, urgent care center, or emergency room. ? ?If you have a medical emergency, please immediately call 911 or go to the emergency department. ? ?Pager Numbers ? ?- Dr. Nehemiah Massed: [phone] ? ?- Dr. Laurence Ferrari: [phone] ? ?- Dr. Nicole Kindred: [phone] ? ?In the event of inclement weather, please call our main line at [phone] for an update on the status of any delays or closures. ? ?Dermatology Medication Tips: ?Please keep the boxes that  topical medications come in in order to help keep track of the instructions about where and how to use these. Pharmacies typically print the medication instructions only on the boxes and not directly on the medication tubes.  ? ?If your medication is too expensive, please contact our office at [phone] option 4 or send [place] a message through [place].  ? ?We are unable to tell what your co-pay for medications will be in advance as this is different depending on your insurance coverage. However, we may be able to find a substitute medication at lower cost or fill out paperwork to get insurance to cover a needed medication.  ? ?If a prior authorization is  required to get your medication covered by your insurance company, please allow [place] 1-2 business days to complete this process. ? ?Drug prices often vary depending on where the prescription is filled and some pharmac

## 2021-11-24 NOTE — Progress Notes (Signed)
? ?Follow-Up Visit ?  ?Subjective  ?Rebecca Burton is a 71 y.o. female who presents for the following: Actinic Keratosis (Hx of aks, had discussed PDT at last appointment, patient changed mind about, patient reports fluorouracil cream, patient reports a spot at left cheek ) and Seborrheic Keratosis (Hx of ISKs ,). ? ?The patient has spots, moles and lesions to be evaluated, some may be new or changing and the patient has concerns that these could be cancer. ? ? ?The following portions of the chart were reviewed this encounter and updated as appropriate:   ?  ? ?Review of Systems: No other skin or systemic complaints except as noted in HPI or Assessment and Plan. ? ? ?Objective  ?Well appearing patient in no apparent distress; mood and affect are within normal limits. ? ?A focused examination was performed including back, neck, face, b/l arms, b/l hands. Relevant physical exam findings are noted in the Assessment and Plan. ? ?left malar cheek 4, left cheek x 1, left eyebrow x 1, right upper forehead x 1 (7) ?Erythematous thin papules/macules with gritty scale.  ? ? ?Assessment & Plan  ?Actinic keratosis (7) ?left malar cheek 4, left cheek x 1, left eyebrow x 1, right upper forehead x 1 ? ? ?Start 5-fluorouracil/calcipotriene cream twice a day for 4 - 7 days to affected areas including forehead, b/l eyebrows, b/l temples, b/l cheeks , nose. Prescription sent to [facility]. Patient provided with contact information for pharmacy and advised the pharmacy will mail the prescription to their home. Patient provided with handout reviewing treatment course and side effects and advised to call or message [place] on MyChart with any concerns. ? ?5-fluorouracil/calcipotriene cream is is a type of field treatment used to treat precancers, thin skin cancers, and areas of sun damage. Expected reaction includes irritation and mild inflammation potentially progressing to more severe inflammation including redness, scaling,  crusting and open sores/erosions.  If too much irritation occurs, ensure application of only a thin layer and decrease frequency of use to achieve a tolerable level of inflammation. Recommend applying Vaseline ointment to open sores as needed.  Minimize sun exposure while under treatment. Recommend daily broad spectrum sunscreen SPF 30+ to sun-exposed areas, reapply every 2 hours as needed.  ? ?   ? ? ?Actinic keratoses are precancerous spots that appear secondary to cumulative UV radiation exposure/sun exposure over time. They are chronic with expected duration over 1 year. A portion of actinic keratoses will progress to squamous cell carcinoma of the skin. It is not possible to reliably predict which spots will progress to skin cancer and so treatment is recommended to prevent development of skin cancer. ? ?Recommend daily broad spectrum sunscreen SPF 30+ to sun-exposed areas, reapply every 2 hours as needed.  ?Recommend staying in the shade or wearing long sleeves, sun glasses (UVA+UVB protection) and wide brim hats (4-inch brim around the entire circumference of the hat). ?Call for new or changing lesions. ? ?fluorouracil (EFUDEX) 5 % cream - left malar cheek 4, left cheek x 1, left eyebrow x 1, right upper forehead x 1 ?Apply topically 2 (two) times daily. Apply to forehead, cheeks, nose, temples , eyebrows for 4 - 7 days ? ?Destruction of lesion - left malar cheek 4, left cheek x 1, left eyebrow x 1, right upper forehead x 1 ? ?Destruction method: cryotherapy   ?Informed consent: discussed and consent obtained   ?Lesion destroyed using liquid nitrogen: Yes   ?Region frozen until ice ball extended  beyond lesion: Yes   ?Outcome: patient tolerated procedure well with no complications   ?Post-procedure details: wound care instructions given   ?Additional details:  Prior to procedure, discussed risks of blister formation, small wound, skin dyspigmentation, or rare scar following cryotherapy. Recommend Vaseline  ointment to treated areas while healing. ? ? ? 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 Chronic rubbing or scratching causes darkening of the skin.  ?- OTC treatments which can help with itch include numbing creams like pramoxine or lidocaine which temporarily reduce itch or Capsaicin-containing creams which cause a burning sensation but which sometimes over time will reset the nerves to stop producing itch.  ?- If you choose to use Capsaicin cream, it is recommended to use it 5 times daily for 1 week followed by 3 times daily for 3-6 weeks. You may have to continue using it long-term.  ?- For severe cases, there are some prescription cream or pill options which may help. ? ? ?Actinic Damage - Severe, confluent actinic changes with pre-cancerous actinic keratoses  ?- Severe, chronic, not at goal, secondary to cumulative UV radiation exposure over time ?- diffuse scaly erythematous macules and papules with underlying dyspigmentation ?- Discussed Prescription "Field Treatment" for Severe, Chronic Confluent Actinic Changes with Pre-Cancerous Actinic Keratoses ?Field treatment involves treatment of an entire area of skin that has confluent Actinic Changes (Sun/ Ultraviolet light damage) and PreCancerous Actinic Keratoses by method of PhotoDynamic Therapy (PDT) and/or prescription Topical Chemotherapy agents such as 5-fluorouracil, 5-fluorouracil/calcipotriene, and/or imiquimod.  The purpose is to decrease the number of clinically evident and subclinical PreCancerous lesions to prevent progression to development of skin cancer by chemically destroying early precancer changes that may or may not be visible.  It has been shown to reduce the risk of developing skin cancer in the treated area. As a result of treatment, redness, scaling, crusting, and open sores may  occur during treatment course. One or more than one of these methods may be used and may have to be used several times to control, suppress and eliminate the PreCancerous changes. Discussed treatment course, expected reaction, and possible side effects. ?- Recommend daily broad spectrum sunscreen SPF 30+ to sun-exposed areas, reapply every 2 hours as needed.  ?- Staying in the shade or wearing long sleeves, sun glasses (UVA+UVB protection) and wide brim hats (4-inch brim around the entire circumference of the hat) are also recommended. ?- Call for new or changing lesions. ?Start 5-fluorouracil/calcipotriene cream twice a day for 4 - 7 days to affected areas including cheeks, forehead and temple . Prescription sent to [facility]. Patient provided with contact information for pharmacy and advised the pharmacy will mail the prescription to their home. Patient provided with handout reviewing treatment course and side effects and advised to call or message [place] on MyChart with any concerns. ? ?Return in about 6 months (around 05/27/2022) for ak followup and skin check. ?I, Ruthell Rummage, CMA, am acting as scribe for Brendolyn Patty, MD. ? ?Documentation: I have reviewed the above documentation for accuracy and completeness, and I agree with the above. ? ?Brendolyn Patty MD  ? ?

## 2022-05-31 ENCOUNTER — Ambulatory Visit: Payer: Medicare HMO | Admitting: Dermatology

## 2022-05-31 DIAGNOSIS — L570 Actinic keratosis: Secondary | ICD-10-CM | POA: Diagnosis not present

## 2022-05-31 DIAGNOSIS — L578 Other skin changes due to chronic exposure to nonionizing radiation: Secondary | ICD-10-CM

## 2022-05-31 DIAGNOSIS — L821 Other seborrheic keratosis: Secondary | ICD-10-CM | POA: Diagnosis not present

## 2022-05-31 DIAGNOSIS — L820 Inflamed seborrheic keratosis: Secondary | ICD-10-CM

## 2022-05-31 NOTE — Patient Instructions (Addendum)
Cryotherapy Aftercare  Wash gently with soap and water everyday.   Apply Vaseline and Band-Aid daily until healed.     Due to recent changes in healthcare laws, you may see results of your pathology and/or laboratory studies on MyChart before the doctors have had a chance to review them. We understand that in some cases there may be results that are confusing or concerning to you. Please understand that not all results are received at the same time and often the doctors may need to interpret multiple results in order to provide you with the best plan of care or course of treatment. Therefore, we ask that you please give us 2 business days to thoroughly review all your results before contacting the office for clarification. Should we see a critical lab result, you will be contacted sooner.   If You Need Anything After Your Visit  If you have any questions or concerns for your doctor, please call our main line at [phone] and press option 4 to reach your doctor's medical assistant. If no one answers, please leave a voicemail as directed and we will return your call as soon as possible. Messages left after 4 pm will be answered the following business day.   You may also send us a message via MyChart. We typically respond to MyChart messages within 1-2 business days.  For prescription refills, please ask your pharmacy to contact our office. Our fax number is [phone].  If you have an urgent issue when the clinic is closed that cannot wait until the next business day, you can page your doctor at the number below.    Please note that while we do our best to be available for urgent issues outside of office hours, we are not available 24/7.   If you have an urgent issue and are unable to reach us, you may choose to seek medical care at your doctor's office, retail clinic, urgent care center, or emergency room.  If you have a medical emergency, please immediately call 911 or go to the  emergency department.  Pager Numbers  - Dr. Kowalski: [phone]  - Dr. Moye: [phone]  - Dr. Stewart: [phone]  In the event of inclement weather, please call our main line at [phone] for an update on the status of any delays or closures.  Dermatology Medication Tips: Please keep the boxes that topical medications come in in order to help keep track of the instructions about where and how to use these. Pharmacies typically print the medication instructions only on the boxes and not directly on the medication tubes.   If your medication is too expensive, please contact our office at [phone] option 4 or send us a message through MyChart.   We are unable to tell what your co-pay for medications will be in advance as this is different depending on your insurance coverage. However, we may be able to find a substitute medication at lower cost or fill out paperwork to get insurance to cover a needed medication.   If a prior authorization is required to get your medication covered by your insurance company, please allow us 1-2 business days to complete this process.  Drug prices often vary depending on where the prescription is filled and some pharmacies may offer cheaper prices.  The website www.goodrx.com contains coupons for medications through different pharmacies. The prices here do not account for what the cost may be with help from insurance (it may be cheaper with your insurance), but the website can   give you the price if you did not use any insurance.  - You can print the associated coupon and take it with your prescription to the pharmacy.  - You may also stop by our office during regular business hours and pick up a GoodRx coupon card.  - If you need your prescription sent electronically to a different pharmacy, notify our office through  MyChart or by phone at [phone] option 4.     Si Usted Necesita Algo Despus de Su Visita  Tambin puede  enviarnos un mensaje a travs de MyChart. Por lo general respondemos a los mensajes de MyChart en el transcurso de 1 a 2 das hbiles.  Para renovar recetas, por favor pida a su farmacia que se ponga en contacto con nuestra oficina. Nuestro nmero de fax es el [phone].  Si tiene un asunto urgente cuando la clnica est cerrada y que no puede esperar hasta el siguiente da hbil, puede llamar/localizar a su doctor(a) al nmero que aparece a continuacin.   Por favor, tenga en cuenta que aunque hacemos todo lo posible para estar disponibles para asuntos urgentes fuera del horario de oficina, no estamos disponibles las 24 horas del da, los 7 das de la semana.   Si tiene un problema urgente y no puede comunicarse con nosotros, puede optar por buscar atencin mdica  en el consultorio de su doctor(a), en una clnica privada, en un centro de atencin urgente o en una sala de emergencias.  Si tiene una emergencia mdica, por favor llame inmediatamente al 911 o vaya a la sala de emergencias.  Nmeros de bper  - Dr. Kowalski: [phone]  - Dra. Moye: [phone]  - Dra. Stewart: [phone]  En caso de inclemencias del tiempo, por favor llame a nuestra lnea principal al [phone] para una actualizacin sobre el estado de cualquier retraso o cierre.  Consejos para la medicacin en dermatologa: Por favor, guarde las cajas en las que vienen los medicamentos de uso tpico para ayudarle a seguir las instrucciones sobre dnde y cmo usarlos. Las farmacias generalmente imprimen las instrucciones del medicamento slo en las cajas y no directamente en los tubos del medicamento.   Si su medicamento es muy caro, por favor, pngase en contacto con nuestra oficina llamando al [phone] y presione la opcin 4 o envenos un mensaje a travs de MyChart.   No podemos decirle cul ser su copago por los medicamentos por adelantado ya que esto es diferente dependiendo de la cobertura de su seguro.  Sin embargo, es posible que podamos encontrar un medicamento sustituto a menor costo o llenar un formulario para que el seguro cubra el medicamento que se considera necesario.   Si se requiere una autorizacin previa para que su compaa de seguros cubra su medicamento, por favor permtanos de 1 a 2 das hbiles para completar este proceso.  Los precios de los medicamentos varan con frecuencia dependiendo del lugar de dnde se surte la receta y alguna farmacias pueden ofrecer precios ms baratos.  El sitio web www.goodrx.com tiene cupones para medicamentos de diferentes farmacias. Los precios aqu no tienen en cuenta lo que podra costar con la ayuda del seguro (puede ser ms barato con su seguro), pero el sitio web puede darle el precio si no utiliz ningn seguro.  - Puede imprimir el cupn correspondiente y llevarlo con su receta a la farmacia.  - Tambin puede pasar por nuestra oficina durante el horario de atencin regular y recoger una tarjeta de cupones de GoodRx.  -   Si necesita que su receta se enve electrnicamente a una farmacia diferente, informe a nuestra oficina a travs de MyChart de [place] o por telfono llamando al [phone] y presione la opcin 4.  

## 2022-05-31 NOTE — Progress Notes (Signed)
Follow-Up Visit   Subjective  Rebecca Burton is a 71 y.o. female who presents for the following: Follow-up (Patient here today for 6 month AK and ISK follow up. Patient was able to use 5FU/calcipotriene at forehead, b/l eyebrows, b/l temples, b/l cheeks, nose twice daily for 7 days with good reaction. ).  Patient also with a few rough spots at left arm.   The following portions of the chart were reviewed this encounter and updated as appropriate:       Review of Systems:  No other skin or systemic complaints except as noted in HPI or Assessment and Plan.  Objective  Well appearing patient in no apparent distress; mood and affect are within normal limits.  A focused examination was performed including arms, abdomen, face. Relevant physical exam findings are noted in the Assessment and Plan.  R malar cheek x 1, L medial cheek x 1, L nasal dorsum x 1, L wrist x 2 (5) Erythematous thin papules/macules with gritty scale.   Left Upper Arm x 2 (2) Erythematous stuck-on, waxy papule or plaque  right anterior thigh, abdomen 1.5 x 1 cm waxy tan patch with irregular border at right anterior thigh, stable when compared to photo Waxy tan papule    Assessment & Plan  AK (actinic keratosis) (5) R malar cheek x 1, L medial cheek x 1, L nasal dorsum x 1, L wrist x 2  Good results post 5FU/VitD cream 7 day course  Actinic keratoses are precancerous spots that appear secondary to cumulative UV radiation exposure/sun exposure over time. They are chronic with expected duration over 1 year. A portion of actinic keratoses will progress to squamous cell carcinoma of the skin. It is not possible to reliably predict which spots will progress to skin cancer and so treatment is recommended to prevent development of skin cancer.  Recommend daily broad spectrum sunscreen SPF 30+ to sun-exposed areas, reapply every 2 hours as needed.  Recommend staying in the shade or wearing long sleeves, sun glasses  (UVA+UVB protection) and wide brim hats (4-inch brim around the entire circumference of the hat). Call for new or changing lesions.  Destruction of lesion - R malar cheek x 1, L medial cheek x 1, L nasal dorsum x 1, L wrist x 2  Destruction method: cryotherapy   Informed consent: discussed and consent obtained   Lesion destroyed using liquid nitrogen: Yes   Region frozen until ice ball extended beyond lesion: Yes   Outcome: patient tolerated procedure well with no complications   Post-procedure details: wound care instructions given   Additional details:  Prior to procedure, discussed risks of blister formation, small wound, skin dyspigmentation, or rare scar following cryotherapy. Recommend Vaseline ointment to treated areas while healing.   Inflamed seborrheic keratosis (2) Left Upper Arm x 2  Symptomatic, irritating, patient would like treated.   Destruction of lesion - Left Upper Arm x 2  Destruction method: cryotherapy   Informed consent: discussed and consent obtained   Lesion destroyed using liquid nitrogen: Yes   Region frozen until ice ball extended beyond lesion: Yes   Outcome: patient tolerated procedure well with no complications   Post-procedure details: wound care instructions given   Additional details:  Prior to procedure, discussed risks of blister formation, small wound, skin dyspigmentation, or rare scar following cryotherapy. Recommend Vaseline ointment to treated areas while healing.   Seborrheic keratosis right anterior thigh, abdomen  Reassured benign age-related growth.  Stable compared to previous visit. Recommend  observation.  Discussed cryotherapy if spot(s) become irritated or inflamed.   Actinic Damage - chronic, secondary to cumulative UV radiation exposure/sun exposure over time - diffuse scaly erythematous macules with underlying dyspigmentation - Recommend daily broad spectrum sunscreen SPF 30+ to sun-exposed areas, reapply every 2 hours as  needed.  - Recommend staying in the shade or wearing long sleeves, sun glasses (UVA+UVB protection) and wide brim hats (4-inch brim around the entire circumference of the hat). - Call for new or changing lesions.  Lentigines - Scattered tan macules - Due to sun exposure - Benign-appearing, observe - Recommend daily broad spectrum sunscreen SPF 30+ to sun-exposed areas, reapply every 2 hours as needed. - Call for any changes  Return in about 6 months (around 11/29/2022) for TBSE, Hx AK.  Graciella Belton, RMA, am acting as scribe for Brendolyn Patty, MD .  Documentation: I have reviewed the above documentation for accuracy and completeness, and I agree with the above.  Brendolyn Patty MD

## 2022-08-18 ENCOUNTER — Institutional Professional Consult (permissible substitution): Payer: Medicare HMO | Admitting: Cardiology

## 2022-09-02 ENCOUNTER — Other Ambulatory Visit: Payer: Self-pay | Admitting: Family Medicine

## 2022-09-02 DIAGNOSIS — Z1231 Encounter for screening mammogram for malignant neoplasm of breast: Secondary | ICD-10-CM

## 2022-10-04 ENCOUNTER — Ambulatory Visit
Admission: RE | Admit: 2022-10-04 | Discharge: 2022-10-04 | Disposition: A | Discharge status: Home / Self Care | Payer: Medicare HMO | Source: Ambulatory Visit | Attending: Family Medicine | Admitting: Family Medicine

## 2022-10-04 DIAGNOSIS — Z1231 Encounter for screening mammogram for malignant neoplasm of breast: Secondary | ICD-10-CM | POA: Diagnosis not present

## 2022-11-11 ENCOUNTER — Ambulatory Visit: Payer: Medicare HMO | Admitting: Certified Registered Nurse Anesthetist

## 2022-11-11 ENCOUNTER — Other Ambulatory Visit: Payer: Self-pay

## 2022-11-11 ENCOUNTER — Encounter: Payer: Self-pay | Admitting: Internal Medicine

## 2022-11-11 ENCOUNTER — Ambulatory Visit
Admission: RE | Admit: 2022-11-11 | Discharge: 2022-11-11 | Disposition: A | Discharge status: Home / Self Care | Payer: Medicare HMO | Attending: Internal Medicine | Admitting: Internal Medicine

## 2022-11-11 ENCOUNTER — Encounter
Admission: RE | Disposition: A | Discharge status: Home / Self Care | Payer: Self-pay | Source: Home / Self Care | Attending: Internal Medicine

## 2022-11-11 DIAGNOSIS — I4891 Unspecified atrial fibrillation: Secondary | ICD-10-CM | POA: Diagnosis present

## 2022-11-11 DIAGNOSIS — I4510 Unspecified right bundle-branch block: Secondary | ICD-10-CM | POA: Insufficient documentation

## 2022-11-11 HISTORY — PX: CARDIOVERSION: SHX1299

## 2022-11-11 SURGERY — CARDIOVERSION
Anesthesia: General

## 2022-11-11 MED ORDER — SODIUM CHLORIDE 0.9 % IV SOLN: INTRAVENOUS | Status: DC

## 2022-11-11 MED ORDER — PROPOFOL 10 MG/ML IV BOLUS
INTRAVENOUS | Status: DC | PRN
  Administered 2022-11-11: 70 mg via INTRAVENOUS

## 2022-11-11 MED ORDER — PROPOFOL 10 MG/ML IV BOLUS
INTRAVENOUS | Status: AC
  Filled 2022-11-11: qty 40

## 2022-11-11 NOTE — Anesthesia Procedure Notes (Signed)
Date/Time: 11/11/2022 7:38 AM  Performed by: Malva Cogan, CRNAPre-anesthesia Checklist: Patient identified, Emergency Drugs available, Suction available, Patient being monitored and Timeout performed Patient Re-evaluated:Patient Re-evaluated prior to induction Oxygen Delivery Method: Nasal cannula Induction Type: IV induction Placement Confirmation: CO2 detector and positive ETCO2

## 2022-11-11 NOTE — Transfer of Care (Signed)
Immediate Anesthesia Transfer of Care Note  Patient: Rebecca Burton  Procedure(s) Performed: CARDIOVERSION  Patient Location: Specials   Anesthesia Type:General  Level of Consciousness: awake, alert , and oriented  Airway & Oxygen Therapy: Patient Spontanous Breathing and Patient connected to nasal cannula oxygen  Post-op Assessment: Report given to RN and Post -op Vital signs reviewed and stable  Post vital signs: Reviewed and stable  Last Vitals:  Vitals Value Taken Time  BP 122/79 11/11/22 0742  Temp    Pulse 72 11/11/22 0744  Resp 21 11/11/22 0744  SpO2 95 % 11/11/22 0744    Last Pain:  Vitals:   11/11/22 0658  TempSrc: Oral  PainSc: 0-No pain         Complications: No notable events documented.

## 2022-11-11 NOTE — Anesthesia Preprocedure Evaluation (Signed)
Anesthesia Evaluation  Patient identified by MRN, date of birth, ID band Patient awake    Reviewed: Allergy & Precautions, NPO status , Patient's Chart, lab work & pertinent test results, reviewed documented beta blocker date and time   History of Anesthesia Complications Negative for: history of anesthetic complications  Airway Mallampati: III  TM Distance: >3 FB     Dental  (+) Chipped, Dental Advidsory Given, Caps Permanent bridge:   Pulmonary neg pulmonary ROS   Pulmonary exam normal        Cardiovascular Exercise Tolerance: Good hypertension, Pt. on medications (-) angina (-) Past MI and (-) Cardiac Stents + dysrhythmias Atrial Fibrillation (-) Valvular Problems/Murmurs Rhythm:Irregular Rate:Abnormal     Neuro/Psych   Anxiety     negative neurological ROS     GI/Hepatic Neg liver ROS,GERD  Medicated,,  Endo/Other  neg diabetes  Morbid obesity  Renal/GU negative Renal ROS     Musculoskeletal   Abdominal  (+) + obese  Peds  Hematology   Anesthesia Other Findings . A-fib (CMS-HCC)  . Acrochordon  . Allergic rhinitis  . Anxiety  . Diverticulitis  . GERD (gastroesophageal reflux disease)  . HTN (hypertension)  . Hyperlipidemia  . Myalgia  . Obesity  . Osteopenia   Reproductive/Obstetrics                             Anesthesia Physical Anesthesia Plan  ASA: 3  Anesthesia Plan: General   Post-op Pain Management:    Induction: Intravenous  PONV Risk Score and Plan: TIVA and Propofol infusion  Airway Management Planned: Natural Airway and Nasal Cannula  Additional Equipment:   Intra-op Plan:   Post-operative Plan:   Informed Consent: I have reviewed the patients History and Physical, chart, labs and discussed the procedure including the risks, benefits and alternatives for the proposed anesthesia with the patient or authorized representative who has indicated his/her  understanding and acceptance.       Plan Discussed with: CRNA, Anesthesiologist and Surgeon  Anesthesia Plan Comments:         Anesthesia Quick Evaluation

## 2022-11-12 ENCOUNTER — Encounter: Payer: Self-pay | Admitting: Internal Medicine

## 2022-11-15 NOTE — CV Procedure (Signed)
Electrical Cardioversion Procedure Note   Procedure: Electrical Cardioversion Indications:  Atrial Fibrillation  Procedure Details Consent: Risks of procedure as well as the alternatives and risks of each were explained to the (patient/caregiver).  Consent for procedure obtained. Time Out: Verified patient identification, verified procedure, site/side was marked, verified correct patient position, special equipment/implants available, medications/allergies/relevent history reviewed, required imaging and test results available.  Performed  Patient placed on cardiac monitor, pulse oximetry, supplemental oxygen as necessary.  Sedation given:  Propofol as per anesthesia Pacer pads placed anterior and posterior chest.  Cardioverted 1 time(s).  Cardioverted at 120J.  Evaluation Findings: Post procedure EKG shows: NSR Complications: None Patient did tolerate procedure well.   Dorothyann Peng MD 11/11/2022 8:30a

## 2022-11-23 ENCOUNTER — Other Ambulatory Visit: Payer: Self-pay | Admitting: Internal Medicine

## 2022-11-23 DIAGNOSIS — I2089 Other forms of angina pectoris: Secondary | ICD-10-CM

## 2022-11-23 NOTE — Anesthesia Postprocedure Evaluation (Signed)
Anesthesia Post Note  Patient: Rebecca Burton  Procedure(s) Performed: CARDIOVERSION  Patient location during evaluation: Specials Recovery Anesthesia Type: General Level of consciousness: awake and alert Pain management: pain level controlled Vital Signs Assessment: post-procedure vital signs reviewed and stable Respiratory status: spontaneous breathing, nonlabored ventilation, respiratory function stable and patient connected to nasal cannula oxygen Cardiovascular status: blood pressure returned to baseline and stable Postop Assessment: no apparent nausea or vomiting Anesthetic complications: no   No notable events documented.   Last Vitals:  Vitals:   11/11/22 0800 11/11/22 0815  BP: 115/75 (!) 115/51  Pulse: 72 69  Resp: (!) 25 17  Temp:    SpO2: 95% 94%    Last Pain:  Vitals:   11/11/22 0815  TempSrc:   PainSc: 0-No pain                 Lenard Simmer

## 2022-11-24 ENCOUNTER — Encounter
Admission: RE | Disposition: A | Discharge status: Home / Self Care | Payer: Self-pay | Source: Home / Self Care | Attending: Internal Medicine

## 2022-11-24 ENCOUNTER — Encounter: Payer: Self-pay | Admitting: Internal Medicine

## 2022-11-24 ENCOUNTER — Ambulatory Visit: Payer: Medicare HMO | Admitting: General Practice

## 2022-11-24 ENCOUNTER — Ambulatory Visit
Admission: RE | Admit: 2022-11-24 | Discharge: 2022-11-24 | Disposition: A | Discharge status: Home / Self Care | Payer: Medicare HMO | Attending: Internal Medicine | Admitting: Internal Medicine

## 2022-11-24 DIAGNOSIS — I4891 Unspecified atrial fibrillation: Secondary | ICD-10-CM

## 2022-11-24 DIAGNOSIS — I4510 Unspecified right bundle-branch block: Secondary | ICD-10-CM | POA: Insufficient documentation

## 2022-11-24 HISTORY — PX: CARDIOVERSION: SHX1299

## 2022-11-24 SURGERY — CARDIOVERSION
Anesthesia: General

## 2022-11-24 MED ORDER — PROPOFOL 10 MG/ML IV BOLUS
INTRAVENOUS | Status: DC | PRN
  Administered 2022-11-24: 10 mg via INTRAVENOUS
  Administered 2022-11-24: 70 mg via INTRAVENOUS
  Administered 2022-11-24: 20 mg via INTRAVENOUS

## 2022-11-24 MED ORDER — SODIUM CHLORIDE 0.9 % IV SOLN: INTRAVENOUS | Status: DC

## 2022-11-24 MED ORDER — PROPOFOL 10 MG/ML IV BOLUS
INTRAVENOUS | Status: AC
  Filled 2022-11-24: qty 20

## 2022-11-24 NOTE — Anesthesia Postprocedure Evaluation (Signed)
Anesthesia Post Note  Patient: Rebecca Burton  Procedure(s) Performed: CARDIOVERSION  Patient location during evaluation: Specials Recovery Anesthesia Type: General Level of consciousness: awake and alert Pain management: pain level controlled Vital Signs Assessment: post-procedure vital signs reviewed and stable Respiratory status: spontaneous breathing, nonlabored ventilation, respiratory function stable and patient connected to nasal cannula oxygen Cardiovascular status: blood pressure returned to baseline and stable Postop Assessment: no apparent nausea or vomiting Anesthetic complications: no  No notable events documented.   Last Vitals:  Vitals:   11/24/22 0746 11/24/22 0747  BP:  105/66  Pulse: 74   Resp: (!) 22 20  Temp:    SpO2: 90% 97%    Last Pain:  Vitals:   11/24/22 0704  PainSc: 0-No pain                 Stephanie Coup

## 2022-11-24 NOTE — Transfer of Care (Signed)
Immediate Anesthesia Transfer of Care Note  Patient: Rebecca Burton  Procedure(s) Performed: CARDIOVERSION  Patient Location: PACU and Nursing Unit  Anesthesia Type:General  Level of Consciousness: drowsy and patient cooperative  Airway & Oxygen Therapy: Patient Spontanous Breathing and Patient connected to nasal cannula oxygen  Post-op Assessment: Report given to RN and Post -op Vital signs reviewed and stable  Post vital signs: Reviewed and stable  Last Vitals:  Vitals Value Taken Time  BP 105/66 11/24/22 0747  Temp    Pulse 69 11/24/22 0747  Resp 21 11/24/22 0747  SpO2 96 % 11/24/22 0747  Vitals shown include unvalidated device data.  Last Pain:  Vitals:   11/24/22 0704  PainSc: 0-No pain         Complications: No notable events documented.

## 2022-11-24 NOTE — Anesthesia Preprocedure Evaluation (Signed)
Anesthesia Evaluation  Patient identified by MRN, date of birth, ID band Patient awake    Reviewed: Allergy & Precautions, NPO status , Patient's Chart, lab work & pertinent test results, reviewed documented beta blocker date and time   History of Anesthesia Complications Negative for: history of anesthetic complications  Airway Mallampati: III  TM Distance: >3 FB     Dental  (+) Chipped, Dental Advidsory Given, Caps Permanent bridge:   Pulmonary sleep apnea    Pulmonary exam normal        Cardiovascular Exercise Tolerance: Good hypertension, Pt. on medications (-) angina (-) Past MI and (-) Cardiac Stents + dysrhythmias Atrial Fibrillation (-) Valvular Problems/Murmurs Rhythm:Irregular Rate:Abnormal     Neuro/Psych   Anxiety     negative neurological ROS     GI/Hepatic Neg liver ROS,GERD  Medicated,,  Endo/Other  neg diabetes  Morbid obesity  Renal/GU negative Renal ROS     Musculoskeletal   Abdominal  (+) + obese  Peds  Hematology   Anesthesia Other Findings . A-fib (CMS-HCC)  . Acrochordon  . Allergic rhinitis  . Anxiety  . Diverticulitis  . GERD (gastroesophageal reflux disease)  . HTN (hypertension)  . Hyperlipidemia  . Myalgia  . Obesity  . Osteopenia   Reproductive/Obstetrics                             Anesthesia Physical Anesthesia Plan  ASA: 3  Anesthesia Plan: General   Post-op Pain Management:    Induction: Intravenous  PONV Risk Score and Plan: TIVA and Propofol infusion  Airway Management Planned: Natural Airway and Nasal Cannula  Additional Equipment:   Intra-op Plan:   Post-operative Plan:   Informed Consent: I have reviewed the patients History and Physical, chart, labs and discussed the procedure including the risks, benefits and alternatives for the proposed anesthesia with the patient or authorized representative who has indicated his/her  understanding and acceptance.     Dental Advisory Given  Plan Discussed with: CRNA, Anesthesiologist and Surgeon  Anesthesia Plan Comments: (Patient consented for risks of anesthesia including but not limited to:  - adverse reactions to medications - risk of airway placement if required - damage to eyes, teeth, lips or other oral mucosa - nerve damage due to positioning  - sore throat or hoarseness - Damage to heart, brain, nerves, lungs, other parts of body or loss of life  Patient voiced understanding.)        Anesthesia Quick Evaluation

## 2022-11-24 NOTE — CV Procedure (Signed)
Electrical Cardioversion Procedure Note   Procedure: Electrical Cardioversion Indications:  Atrial Fibrillation  Procedure Details Consent: Risks of procedure as well as the alternatives and risks of each were explained to the (patient/caregiver).  Consent for procedure obtained. Time Out: Verified patient identification, verified procedure, site/side was marked, verified correct patient position, special equipment/implants available, medications/allergies/relevent history reviewed, required imaging and test results available.  Performed  Patient placed on cardiac monitor, pulse oximetry, supplemental oxygen as necessary.  Sedation given:  Propofol as per anesthesia Pacer pads placed anterior and posterior chest.  Cardioverted 2 time(s).  Cardioverted at 150J.  Evaluation Findings: Post procedure EKG shows: NSR Complications: None Patient did tolerate procedure well.   Dorothyann Peng MD 11/24/2022

## 2022-11-25 ENCOUNTER — Encounter: Payer: Self-pay | Admitting: Internal Medicine

## 2022-12-07 ENCOUNTER — Ambulatory Visit (INDEPENDENT_AMBULATORY_CARE_PROVIDER_SITE_OTHER): Payer: Medicare HMO | Admitting: Dermatology

## 2022-12-07 VITALS — BP 156/98 | HR 91

## 2022-12-07 DIAGNOSIS — L820 Inflamed seborrheic keratosis: Secondary | ICD-10-CM

## 2022-12-07 DIAGNOSIS — L570 Actinic keratosis: Secondary | ICD-10-CM

## 2022-12-07 DIAGNOSIS — I8393 Asymptomatic varicose veins of bilateral lower extremities: Secondary | ICD-10-CM

## 2022-12-07 DIAGNOSIS — Z1283 Encounter for screening for malignant neoplasm of skin: Secondary | ICD-10-CM

## 2022-12-07 DIAGNOSIS — L578 Other skin changes due to chronic exposure to nonionizing radiation: Secondary | ICD-10-CM

## 2022-12-07 DIAGNOSIS — L814 Other melanin hyperpigmentation: Secondary | ICD-10-CM | POA: Diagnosis not present

## 2022-12-07 DIAGNOSIS — D1801 Hemangioma of skin and subcutaneous tissue: Secondary | ICD-10-CM

## 2022-12-07 DIAGNOSIS — L821 Other seborrheic keratosis: Secondary | ICD-10-CM

## 2022-12-07 DIAGNOSIS — D229 Melanocytic nevi, unspecified: Secondary | ICD-10-CM

## 2022-12-07 DIAGNOSIS — X32XXXA Exposure to sunlight, initial encounter: Secondary | ICD-10-CM

## 2022-12-07 DIAGNOSIS — W908XXA Exposure to other nonionizing radiation, initial encounter: Secondary | ICD-10-CM

## 2022-12-07 NOTE — Patient Instructions (Addendum)
Cryotherapy Aftercare  Wash gently with soap and water everyday.   Apply Vaseline and Band-Aid daily until healed.     Actinic keratoses are precancerous spots that appear secondary to cumulative UV radiation exposure/sun exposure over time. They are chronic with expected duration over 1 year. A portion of actinic keratoses will progress to squamous cell carcinoma of the skin. It is not possible to reliably predict which spots will progress to skin cancer and so treatment is recommended to prevent development of skin cancer.  Recommend daily broad spectrum sunscreen SPF 30+ to sun-exposed areas, reapply every 2 hours as needed.  Recommend staying in the shade or wearing long sleeves, sun glasses (UVA+UVB protection) and wide brim hats (4-inch brim around the entire circumference of the hat). Call for new or changing lesions.     Seborrheic Keratosis  What causes seborrheic keratoses? Seborrheic keratoses are harmless, common skin growths that first appear during adult life.  As time goes by, more growths appear.  Some people may develop a large number of them.  Seborrheic keratoses appear on both covered and uncovered body parts.  They are not caused by sunlight.  The tendency to develop seborrheic keratoses can be inherited.  They vary in color from skin-colored to gray, brown, or even black.  They can be either smooth or have a rough, warty surface.   Seborrheic keratoses are superficial and look as if they were stuck on the skin.  Under the microscope this type of keratosis looks like layers upon layers of skin.  That is why at times the top layer may seem to fall off, but the rest of the growth remains and re-grows.    Treatment Seborrheic keratoses do not need to be treated, but can easily be removed in the office.  Seborrheic keratoses often cause symptoms when they rub on clothing or jewelry.  Lesions can be in the way of shaving.  If they become inflamed, they can cause itching,  soreness, or burning.  Removal of a seborrheic keratosis can be accomplished by freezing, burning, or surgery. If any spot bleeds, scabs, or grows rapidly, please return to have it checked, as these can be an indication of a skin cancer.     Melanoma ABCDEs  Melanoma is the most dangerous type of skin cancer, and is the leading cause of death from skin disease.  You are more likely to develop melanoma if you: Have light-colored skin, light-colored eyes, or red or blond hair Spend a lot of time in the sun Tan regularly, either outdoors or in a tanning bed Have had blistering sunburns, especially during childhood Have a close family member who has had a melanoma Have atypical moles or large birthmarks  Early detection of melanoma is key since treatment is typically straightforward and cure rates are extremely high if we catch it early.   The first sign of melanoma is often a change in a mole or a new dark spot.  The ABCDE system is a way of remembering the signs of melanoma.  A for asymmetry:  The two halves do not match. B for border:  The edges of the growth are irregular. C for color:  A mixture of colors are present instead of an even brown color. D for diameter:  Melanomas are usually (but not always) greater than 6mm - the size of a pencil eraser. E for evolution:  The spot keeps changing in size, shape, and color.  Please check your skin once per month between   visits. You can use a small mirror in front and a large mirror behind you to keep an eye on the back side or your body.   If you see any new or changing lesions before your next follow-up, please call to schedule a visit.  Please continue daily skin protection including broad spectrum sunscreen SPF 30+ to sun-exposed areas, reapplying every 2 hours as needed when you're outdoors.   Staying in the shade or wearing long sleeves, sun glasses (UVA+UVB protection) and wide brim hats (4-inch brim around the entire circumference  of the hat) are also recommended for sun protection.    Due to recent changes in healthcare laws, you may see results of your pathology and/or laboratory studies on MyChart before the doctors have had a chance to review them. We understand that in some cases there may be results that are confusing or concerning to you. Please understand that not all results are received at the same time and often the doctors may need to interpret multiple results in order to provide you with the best plan of care or course of treatment. Therefore, we ask that you please give us 2 business days to thoroughly review all your results before contacting the office for clarification. Should we see a critical lab result, you will be contacted sooner.   If You Need Anything After Your Visit  If you have any questions or concerns for your doctor, please call our main line at [phone] and press option 4 to reach your doctor's medical assistant. If no one answers, please leave a voicemail as directed and we will return your call as soon as possible. Messages left after 4 pm will be answered the following business day.   You may also send us a message via MyChart. We typically respond to MyChart messages within 1-2 business days.  For prescription refills, please ask your pharmacy to contact our office. Our fax number is [phone].  If you have an urgent issue when the clinic is closed that cannot wait until the next business day, you can page your doctor at the number below.    Please note that while we do our best to be available for urgent issues outside of office hours, we are not available 24/7.   If you have an urgent issue and are unable to reach us, you may choose to seek medical care at your doctor's office, retail clinic, urgent care center, or emergency room.  If you have a medical emergency, please immediately call 911 or go to the emergency department.  Pager Numbers  - Dr. Kowalski: [phone]  -  Dr. Moye: [phone]  - Dr. Stewart: [phone]  In the event of inclement weather, please call our main line at [phone] for an update on the status of any delays or closures.  Dermatology Medication Tips: Please keep the boxes that topical medications come in in order to help keep track of the instructions about where and how to use these. Pharmacies typically print the medication instructions only on the boxes and not directly on the medication tubes.   If your medication is too expensive, please contact our office at [phone] option 4 or send us a message through MyChart.   We are unable to tell what your co-pay for medications will be in advance as this is different depending on your insurance coverage. However, we may be able to find a substitute medication at lower cost or fill out paperwork to get insurance to cover a needed medication.     If a prior authorization is required to get your medication covered by your insurance company, please allow us 1-2 business days to complete this process.  Drug prices often vary depending on where the prescription is filled and some pharmacies may offer cheaper prices.  The website www.goodrx.com contains coupons for medications through different pharmacies. The prices here do not account for what the cost may be with help from insurance (it may be cheaper with your insurance), but the website can give you the price if you did not use any insurance.  - You can print the associated coupon and take it with your prescription to the pharmacy.  - You may also stop by our office during regular business hours and pick up a GoodRx coupon card.  - If you need your prescription sent electronically to a different pharmacy, notify our office through [facility] MyChart or by phone at [phone] option 4.     Si Usted Necesita Algo Despus de Su Visita  Tambin puede enviarnos un mensaje a travs de MyChart. Por lo general respondemos a los  mensajes de MyChart en el transcurso de 1 a 2 das hbiles.  Para renovar recetas, por favor pida a su farmacia que se ponga en contacto con nuestra oficina. Nuestro nmero de fax es el [phone].  Si tiene un asunto urgente cuando la clnica est cerrada y que no puede esperar hasta el siguiente da hbil, puede llamar/localizar a su doctor(a) al nmero que aparece a continuacin.   Por favor, tenga en cuenta que aunque hacemos todo lo posible para estar disponibles para asuntos urgentes fuera del horario de oficina, no estamos disponibles las 24 horas del da, los 7 das de la semana.   Si tiene un problema urgente y no puede comunicarse con nosotros, puede optar por buscar atencin mdica  en el consultorio de su doctor(a), en una clnica privada, en un centro de atencin urgente o en una sala de emergencias.  Si tiene una emergencia mdica, por favor llame inmediatamente al 911 o vaya a la sala de emergencias.  Nmeros de bper  - Dr. Kowalski: [phone]  - Dra. Moye: [phone]  - Dra. Stewart: [phone]  En caso de inclemencias del tiempo, por favor llame a nuestra lnea principal al [phone] para una actualizacin sobre el estado de cualquier retraso o cierre.  Consejos para la medicacin en dermatologa: Por favor, guarde las cajas en las que vienen los medicamentos de uso tpico para ayudarle a seguir las instrucciones sobre dnde y cmo usarlos. Las farmacias generalmente imprimen las instrucciones del medicamento slo en las cajas y no directamente en los tubos del medicamento.   Si su medicamento es muy caro, por favor, pngase en contacto con nuestra oficina llamando al [phone] y presione la opcin 4 o envenos un mensaje a travs de MyChart.   No podemos decirle cul ser su copago por los medicamentos por adelantado ya que esto es diferente dependiendo de la cobertura de su seguro. Sin embargo, es posible que podamos encontrar un medicamento sustituto a  menor costo o llenar un formulario para que el seguro cubra el medicamento que se considera necesario.   Si se requiere una autorizacin previa para que su compaa de seguros cubra su medicamento, por favor permtanos de 1 a 2 das hbiles para completar este proceso.  Los precios de los medicamentos varan con frecuencia dependiendo del lugar de dnde se surte la receta y alguna farmacias pueden ofrecer precios ms baratos.  El sitio web www.goodrx.com   tiene cupones para medicamentos de diferentes farmacias. Los precios aqu no tienen en cuenta lo que podra costar con la ayuda del seguro (puede ser ms barato con su seguro), pero el sitio web puede darle el precio si no utiliz ningn seguro.  - Puede imprimir el cupn correspondiente y llevarlo con su receta a la farmacia.  - Tambin puede pasar por nuestra oficina durante el horario de atencin regular y recoger una tarjeta de cupones de GoodRx.  - Si necesita que su receta se enve electrnicamente a una farmacia diferente, informe a nuestra oficina a travs de MyChart de [facility] o por telfono llamando al [phone] y presione la opcin 4.  

## 2022-12-07 NOTE — Progress Notes (Signed)
Follow-Up Visit   Subjective  Rebecca Burton is a 72 y.o. female who presents for the following: Skin Cancer Screening and Full Body Skin Exam 6 month tbse hx of aks, hx of isks  Scaly spot at left ear, previously frozen   The patient presents for Total-Body Skin Exam (TBSE) for skin cancer screening and mole check. The patient has spots, moles and lesions to be evaluated, some may be new or changing and the patient has concerns that these could be cancer.    The following portions of the chart were reviewed this encounter and updated as appropriate: medications, allergies, medical history  Review of Systems:  No other skin or systemic complaints except as noted in HPI or Assessment and Plan.  Objective  Well appearing patient in no apparent distress; mood and affect are within normal limits.  A full examination was performed including scalp, head, eyes, ears, nose, lips, neck, chest, axillae, abdomen, back, buttocks, bilateral upper extremities, bilateral lower extremities, hands, feet, fingers, toes, fingernails, and toenails. All findings within normal limits unless otherwise noted below.   Relevant physical exam findings are noted in the Assessment and Plan.  left upper ear helix x 1, left malar cheek x 1, left nasal dorsum x 1 (3) Erythematous thin papules/macules with gritty scale.   right inferior eyebrow x 1 Erythematous stuck-on, waxy papule    Assessment & Plan   LENTIGINES, SEBORRHEIC KERATOSES, HEMANGIOMAS - Benign normal skin lesions - Benign-appearing - Call for any changes Sk at abdomen   MELANOCYTIC NEVI - Tan-brown and/or pink-flesh-colored symmetric macules and papules - Benign appearing on exam today - Observation - Call clinic for new or changing moles - Recommend daily use of broad spectrum spf 30+ sunscreen to sun-exposed areas.   Seborrheic keratosis right anterior thigh   1.5 x 1 cm waxy tan patch with irregular border   Stable when  compared to previous photo  Reassured benign age-related growth.  Recommend observation.  Discussed cryotherapy if spot(s) become irritated or inflamed.   Varicose Veins/Spider Veins - Dilated blue, purple or red veins at the lower extremities - Reassured - Smaller vessels can be treated by sclerotherapy (a procedure to inject a medicine into the veins to make them disappear) if desired, but the treatment is not covered by insurance. Larger vessels may be covered if symptomatic and we would refer to vascular surgeon if treatment desired.   ACTINIC DAMAGE - Chronic condition, secondary to cumulative UV/sun exposure - diffuse scaly erythematous macules with underlying dyspigmentation - Recommend daily broad spectrum sunscreen SPF 30+ to sun-exposed areas, reapply every 2 hours as needed.  - Staying in the shade or wearing long sleeves, sun glasses (UVA+UVB protection) and wide brim hats (4-inch brim around the entire circumference of the hat) are also recommended for sun protection.  - Call for new or changing lesions.  SKIN CANCER SCREENING PERFORMED TODAY.    Actinic keratosis (3) left upper ear helix x 1, left malar cheek x 1, left nasal dorsum x 1  2nd treatment at left upper ear helix   Actinic keratoses are precancerous spots that appear secondary to cumulative UV radiation exposure/sun exposure over time. They are chronic with expected duration over 1 year. A portion of actinic keratoses will progress to squamous cell carcinoma of the skin. It is not possible to reliably predict which spots will progress to skin cancer and so treatment is recommended to prevent development of skin cancer.  Recommend daily broad spectrum sunscreen  SPF 30+ to sun-exposed areas, reapply every 2 hours as needed.  Recommend staying in the shade or wearing long sleeves, sun glasses (UVA+UVB protection) and wide brim hats (4-inch brim around the entire circumference of the hat). Call for new or changing  lesions.  Destruction of lesion - left upper ear helix x 1, left malar cheek x 1, left nasal dorsum x 1  Destruction method: cryotherapy   Informed consent: discussed and consent obtained   Lesion destroyed using liquid nitrogen: Yes   Region frozen until ice ball extended beyond lesion: Yes   Outcome: patient tolerated procedure well with no complications   Post-procedure details: wound care instructions given   Additional details:  Prior to procedure, discussed risks of blister formation, small wound, skin dyspigmentation, or rare scar following cryotherapy. Recommend Vaseline ointment to treated areas while healing.   Inflamed seborrheic keratosis right inferior eyebrow x 1  Symptomatic, irritating, patient would like treated.  Destruction of lesion - right inferior eyebrow x 1  Destruction method: cryotherapy   Informed consent: discussed and consent obtained   Lesion destroyed using liquid nitrogen: Yes   Region frozen until ice ball extended beyond lesion: Yes   Outcome: patient tolerated procedure well with no complications   Post-procedure details: wound care instructions given   Additional details:  Prior to procedure, discussed risks of blister formation, small wound, skin dyspigmentation, or rare scar following cryotherapy. Recommend Vaseline ointment to treated areas while healing.    Return in about 1 year (around 12/07/2023) for TBSE.  I, Asher Muir, CMA, am acting as scribe for Willeen Niece, MD.   Documentation: I have reviewed the above documentation for accuracy and completeness, and I agree with the above.  Willeen Niece, MD

## 2022-12-14 ENCOUNTER — Telehealth (HOSPITAL_COMMUNITY): Payer: Self-pay | Admitting: *Deleted

## 2022-12-14 NOTE — Telephone Encounter (Signed)
Patient returning call about her cardiac CT. She confirms she is still in Afib and states that her ablation and cardioversion have not worked.  Informed her that we cannot perform this test if she is in Afib.  She verbalized understanding.   Larey Brick RN Navigator Cardiac Imaging [facility] [phone] Office [phone] Cell

## 2022-12-15 ENCOUNTER — Ambulatory Visit: Admission: RE | Admit: 2022-12-15 | Payer: Medicare HMO | Source: Ambulatory Visit

## 2022-12-27 ENCOUNTER — Inpatient Hospital Stay: Admission: RE | Admit: 2022-12-27 | Payer: Medicare HMO | Source: Ambulatory Visit

## 2023-09-12 ENCOUNTER — Other Ambulatory Visit: Payer: Self-pay | Admitting: Family Medicine

## 2023-09-12 DIAGNOSIS — Z1231 Encounter for screening mammogram for malignant neoplasm of breast: Secondary | ICD-10-CM

## 2023-10-05 ENCOUNTER — Ambulatory Visit
Admission: RE | Admit: 2023-10-05 | Discharge: 2023-10-05 | Disposition: A | Discharge status: Home / Self Care | Payer: Medicare HMO | Source: Ambulatory Visit | Attending: Family Medicine | Admitting: Family Medicine

## 2023-10-05 DIAGNOSIS — Z1231 Encounter for screening mammogram for malignant neoplasm of breast: Secondary | ICD-10-CM | POA: Insufficient documentation

## 2023-12-13 ENCOUNTER — Encounter: Payer: Medicare HMO | Admitting: Dermatology

## 2024-01-25 ENCOUNTER — Ambulatory Visit (INDEPENDENT_AMBULATORY_CARE_PROVIDER_SITE_OTHER): Admitting: Dermatology

## 2024-01-25 DIAGNOSIS — W908XXA Exposure to other nonionizing radiation, initial encounter: Secondary | ICD-10-CM | POA: Diagnosis not present

## 2024-01-25 DIAGNOSIS — D229 Melanocytic nevi, unspecified: Secondary | ICD-10-CM

## 2024-01-25 DIAGNOSIS — L300 Nummular dermatitis: Secondary | ICD-10-CM

## 2024-01-25 DIAGNOSIS — L578 Other skin changes due to chronic exposure to nonionizing radiation: Secondary | ICD-10-CM | POA: Diagnosis not present

## 2024-01-25 DIAGNOSIS — Z1283 Encounter for screening for malignant neoplasm of skin: Secondary | ICD-10-CM

## 2024-01-25 DIAGNOSIS — L814 Other melanin hyperpigmentation: Secondary | ICD-10-CM

## 2024-01-25 DIAGNOSIS — L719 Rosacea, unspecified: Secondary | ICD-10-CM

## 2024-01-25 DIAGNOSIS — I781 Nevus, non-neoplastic: Secondary | ICD-10-CM

## 2024-01-25 DIAGNOSIS — I8393 Asymptomatic varicose veins of bilateral lower extremities: Secondary | ICD-10-CM

## 2024-01-25 DIAGNOSIS — L570 Actinic keratosis: Secondary | ICD-10-CM

## 2024-01-25 DIAGNOSIS — L821 Other seborrheic keratosis: Secondary | ICD-10-CM

## 2024-01-25 DIAGNOSIS — L304 Erythema intertrigo: Secondary | ICD-10-CM

## 2024-01-25 DIAGNOSIS — D1801 Hemangioma of skin and subcutaneous tissue: Secondary | ICD-10-CM

## 2024-01-25 MED ORDER — HYDROCORTISONE 2.5 % EX CREA: TOPICAL_CREAM | Freq: Two times a day (BID) | CUTANEOUS | 3 refills | Status: AC | PRN

## 2024-01-25 MED ORDER — METRONIDAZOLE 0.75 % EX CREA: TOPICAL_CREAM | CUTANEOUS | 3 refills | Status: AC

## 2024-01-25 MED ORDER — KETOCONAZOLE 2 % EX CREA
TOPICAL_CREAM | CUTANEOUS | 3 refills | Status: AC
Start: 2024-01-25 — End: ?

## 2024-01-25 NOTE — Progress Notes (Signed)
 Follow-Up Visit   Subjective  Rebecca Burton is a 73 y.o. female who presents for the following: Skin Cancer Screening and Full Body Skin Exam. Patient with past hxAKs. Places under L breast sore & painful skin feels raw since March, has been applying baking soda and otc ointment. Rough spots at R forearm x2 L forearm x1 patient reports she does pick at them. Rough spots at L temple and chin. Patient with recent hx cerebral aneurysm July last year.   The patient presents for Total-Body Skin Exam (TBSE) for skin cancer screening and mole check. The patient has spots, moles and lesions to be evaluated, some may be new or changing and the patient may have concern these could be cancer.   The following portions of the chart were reviewed this encounter and updated as appropriate: medications, allergies, medical history  Review of Systems:  No other skin or systemic complaints except as noted in HPI or Assessment and Plan.  Objective  Well appearing patient in no apparent distress; mood and affect are within normal limits.  A full examination was performed including scalp, head, eyes, ears, nose, lips, neck, chest, axillae, abdomen, back, buttocks, bilateral upper extremities, bilateral lower extremities, hands, feet, fingers, toes, fingernails, and toenails. All findings within normal limits unless otherwise noted below.   Relevant physical exam findings are noted in the Assessment and Plan.  R forearm x1, L forearm x4, L hand dorsum x1, L temple x1, L eyebrow x1, L preauricular x1, L malar cheek x2 (11) Pink scaly macules  Assessment & Plan   SKIN CANCER SCREENING PERFORMED TODAY.  ACTINIC DAMAGE - Chronic condition, secondary to cumulative UV/sun exposure - diffuse scaly erythematous macules with underlying dyspigmentation - Recommend daily broad spectrum sunscreen SPF 30+ to sun-exposed areas, reapply every 2 hours as needed.  - Staying in the shade or wearing long sleeves, sun  glasses (UVA+UVB protection) and wide brim hats (4-inch brim around the entire circumference of the hat) are also recommended for sun protection.  - Call for new or changing lesions.  LENTIGINES, SEBORRHEIC KERATOSES, HEMANGIOMAS - Benign normal skin lesions - Benign-appearing - Call for any changes  SEBORRHEIC KERATOSIS - 1.5 x 1 cm waxy tan patch/papule with irregular border at R anterior thigh - Benign-appearing, Stable. - Discussed benign etiology and prognosis. - Observe - Call for any changes  MELANOCYTIC NEVI - Tan-brown and/or pink-flesh-colored symmetric macules and papules - Benign appearing on exam today - Observation - Call clinic for new or changing moles - Recommend daily use of broad spectrum spf 30+ sunscreen to sun-exposed areas.   Varicose Veins/Spider Veins - Dilated blue, purple or red veins at the lower extremities - Reassured - Smaller vessels can be treated by sclerotherapy (a procedure to inject a medicine into the veins to make them disappear) if desired, but the treatment is not covered by insurance. Larger vessels may be covered if symptomatic and we would refer to vascular surgeon if treatment desired.  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 Condition is symptomatic/ bothersome to patient. Not currently at goal.    Nummular dermatitis (eczema) is a chronic, relapsing, itchy rash that can significantly affect quality of life. It is often associated with dry skin and flares in the wintertime, and may require treatment with prescription topical anti-inflammatory medications, in addition to gentle skin care.  If there is associated atopic dermatitis and  topicals are not working, then biologic injections may be necessary to clear rash and control symptoms.  Treatment Plan: Start HC 2.5% apply 1-2 times daily to itchy patches on body  prn.   Topical steroids (such as triamcinolone, fluocinolone, fluocinonide, mometasone, clobetasol, halobetasol, betamethasone, hydrocortisone ) can cause thinning and lightening of the skin if they are used for too long in the same area. Your physician has selected the right strength medicine for your problem and area affected on the body. Please use your medication only as directed by your physician to prevent side effects.    Recommend mild soap and moisturizing cream 1-2 times daily.  Gentle skin care handout provided.    INTERTRIGO Exam: Erythematous macerated patches in body folds, L inframammary  Chronic and persistent condition with duration or expected duration over one year. Condition is bothersome/symptomatic for patient. Currently flared.   Intertrigo is a chronic recurrent rash that occurs in skin fold areas that may be associated with friction; heat; moisture; yeast; fungus; and bacteria.  It is exacerbated by increased movement / activity; sweating; and higher atmospheric temperature.  Use of an absorbant powder such as Zeasorb AF powder or other OTC antifungal powder to the area daily can prevent rash recurrence. Other options to help keep the area dry include blow drying the area after bathing or using antiperspirant products such as Duradry sweat minimizing gel.  Treatment Plan: Start Ketoconazole  2% cream, apply BID to aa, L inframammary.   May also use HC 2.5% cream, apply 1-2 times daily to itchy patches on body prn itch. Avoid applying to face, groin, and axilla. Use as directed. Long-term use can cause thinning of the skin.   Recommend OTC Zeasorb AF powder to body folds daily after shower.  It is often found in the athlete's foot section in the pharmacy.  Avoid using powders that contain cornstarch.  Patient advised to stop using cornstarch.   ROSACEA Exam: erythema with telangiectasias at cheeks, chin  Chronic and persistent condition with duration or expected  duration over one year. Condition is bothersome/symptomatic for patient. Currently flared.   Rosacea is a chronic progressive skin condition usually affecting the face of adults, causing redness and/or acne bumps. It is treatable but not curable. It sometimes affects the eyes (ocular rosacea) as well. It may respond to topical and/or systemic medication and can flare with stress, sun exposure, alcohol, exercise, topical steroids (including hydrocortisone /cortisone 10) and some foods.  Daily application of broad spectrum spf 30+ sunscreen to face is recommended to reduce flares.  Patient denies grittiness of the eyes  Treatment Plan Start metronidazole  cream, apply 1-2 times daily to aa, cheeks, nose, chin. If not flared may use once a day.    AK (ACTINIC KERATOSIS) (11) R forearm x1, L forearm x4, L hand dorsum x1, L temple x1, L eyebrow x1, L preauricular x1, L malar cheek x2 (11) Actinic keratoses are precancerous spots that appear secondary to cumulative UV radiation exposure/sun exposure over time. They are chronic with expected duration over 1 year. A portion of actinic keratoses will progress to squamous cell carcinoma of the skin. It is not possible to reliably predict which spots will progress to skin cancer and so treatment is recommended to prevent development of skin cancer.  Recommend daily broad spectrum sunscreen SPF 30+ to sun-exposed areas, reapply every 2 hours as needed.  Recommend staying in the shade or wearing long sleeves, sun glasses (UVA+UVB protection) and wide brim hats (4-inch brim around the entire circumference  of the hat). Call for new or changing lesions. Destruction of lesion - R forearm x1, L forearm x4, L hand dorsum x1, L temple x1, L eyebrow x1, L preauricular x1, L malar cheek x2 (11)  Destruction method: cryotherapy   Informed consent: discussed and consent obtained   Lesion destroyed using liquid nitrogen: Yes   Region frozen until ice ball extended  beyond lesion: Yes   Outcome: patient tolerated procedure well with no complications   Post-procedure details: wound care instructions given   Additional details:  Prior to procedure, discussed risks of blister formation, small wound, skin dyspigmentation, or rare scar following cryotherapy. Recommend Vaseline ointment to treated areas while healing.    Return in 1 year (on 01/24/2025) for TBSE, w/ Dr. Jackquline.  I, Jacquelynn V. Wilfred, CMA, am acting as scribe for Rexene Jackquline, MD .   Documentation: I have reviewed the above documentation for accuracy and completeness, and I agree with the above.  Rexene Jackquline, MD

## 2024-01-25 NOTE — Patient Instructions (Addendum)
 Cryotherapy Aftercare  Wash gently with soap and water  everyday.   Apply Vaseline and Band-Aid daily until healed.  For rosacea: Start metronidazole  cream, apply 1-2 times daily to aa, cheeks, nose, chin. If not flared may use once a day.    Below breast: Start Ketoconazole  2% cream, apply twice a day to affected areas, L inframammary.   May also use HC 2.5% cream, apply 1-2 times daily as needed itchy patches on body.  Avoid applying to face, groin, and axilla. Use as directed. Long-term use can cause thinning of the skin.   Recommend OTC Zeasorb AF powder to body folds daily after shower.  It is often found in the athlete's foot section in the pharmacy.  Avoid using powders that contain cornstarch.   For nummular dermatitis: May also use HC 2.5% cream, apply 1-2 times daily prn itchy patches on body. Avoid applying to face, groin, and axilla. Use as directed. Long-term use can cause thinning of the skin.   Recommend mild soap and moisturizing cream 1-2 times daily.  Gentle skin care handout provided.     Gentle Skin Care Guide  1. Bathe no more than once a day.  2. Avoid bathing in hot water   3. Use a mild soap like Dove, Vanicream, Cetaphil, CeraVe. Can use Lever 2000 or Cetaphil antibacterial soap  4. Use soap only where you need it. On most days, use it under your arms, between your legs, and on your feet. Let the water  rinse other areas unless visibly dirty.  5. When you get out of the bath/shower, use a towel to gently blot your skin dry, don't rub it.  6. While your skin is still a little damp, apply a moisturizing cream such as Vanicream, CeraVe, Cetaphil, Eucerin, Sarna lotion or plain Vaseline Jelly. For hands apply Neutrogena [place] Hand Cream or Excipial Hand Cream.  7. Reapply moisturizer any time you start to itch or feel dry.  8. Sometimes using free and clear laundry detergents can be helpful. Fabric softener sheets should be avoided. Downy Free & Gentle  liquid, or any liquid fabric softener that is free of dyes and perfumes, it acceptable to use  9. If your doctor has given you prescription creams you may apply moisturizers over them      Recommend daily broad spectrum sunscreen SPF 30+ to sun-exposed areas, reapply every 2 hours as needed. Call for new or changing lesions.  Staying in the shade or wearing long sleeves, sun glasses (UVA+UVB protection) and wide brim hats (4-inch brim around the entire circumference of the hat) are also recommended for sun protection.    Melanoma ABCDEs  Melanoma is the most dangerous type of skin cancer, and is the leading cause of death from skin disease.  You are more likely to develop melanoma if you: Have light-colored skin, light-colored eyes, or red or blond hair Spend a lot of time in the sun Tan regularly, either outdoors or in a tanning bed Have had blistering sunburns, especially during childhood Have a close family member who has had a melanoma Have atypical moles or large birthmarks  Early detection of melanoma is key since treatment is typically straightforward and cure rates are extremely high if we catch it early.   The first sign of melanoma is often a change in a mole or a new dark spot.  The ABCDE system is a way of remembering the signs of melanoma.  A for asymmetry:  The two halves do not match. B for  border:  The edges of the growth are irregular. C for color:  A mixture of colors are present instead of an even brown color. D for diameter:  Melanomas are usually (but not always) greater than 6mm - the size of a pencil eraser. E for evolution:  The spot keeps changing in size, shape, and color.  Please check your skin once per month between visits. You can use a small mirror in front and a large mirror behind you to keep an eye on the back side or your body.   If you see any new or changing lesions before your next follow-up, please call to schedule a visit.  Please continue  daily skin protection including broad spectrum sunscreen SPF 30+ to sun-exposed areas, reapplying every 2 hours as needed when you're outdoors.   Staying in the shade or wearing long sleeves, sun glasses (UVA+UVB protection) and wide brim hats (4-inch brim around the entire circumference of the hat) are also recommended for sun protection.   Due to recent changes in healthcare laws, you may see results of your pathology and/or laboratory studies on MyChart before the doctors have had a chance to review them. We understand that in some cases there may be results that are confusing or concerning to you. Please understand that not all results are received at the same time and often the doctors may need to interpret multiple results in order to provide you with the best plan of care or course of treatment. Therefore, we ask that you please give us  2 business days to thoroughly review all your results before contacting the office for clarification. Should we see a critical lab result, you will be contacted sooner.   If You Need Anything After Your Visit  If you have any questions or concerns for your doctor, please call our main line at [phone] and press option 4 to reach your doctor's medical assistant. If no one answers, please leave a voicemail as directed and we will return your call as soon as possible. Messages left after 4 pm will be answered the following business day.   You may also send us  a message via MyChart. We typically respond to MyChart messages within 1-2 business days.  For prescription refills, please ask your pharmacy to contact our office. Our fax number is [phone].  If you have an urgent issue when the clinic is closed that cannot wait until the next business day, you can page your doctor at the number below.    Please note that while we do our best to be available for urgent issues outside of office hours, we are not available 24/7.   If you have an urgent issue and  are unable to reach us , you may choose to seek medical care at your doctor's office, retail clinic, urgent care center, or emergency room.  If you have a medical emergency, please immediately call 911 or go to the emergency department.  Pager Numbers  - Dr. Hester: [phone]  - Dr. Jackquline: [phone]  - Dr. Claudene: [phone]   In the event of inclement weather, please call our main line at [phone] for an update on the status of any delays or closures.  Dermatology Medication Tips: Please keep the boxes that topical medications come in in order to help keep track of the instructions about where and how to use these. Pharmacies typically print the medication instructions only on the boxes and not directly on the medication tubes.   If your medication is  too expensive, please contact our office at [phone] option 4 or send us  a message through MyChart.   We are unable to tell what your co-pay for medications will be in advance as this is different depending on your insurance coverage. However, we may be able to find a substitute medication at lower cost or fill out paperwork to get insurance to cover a needed medication.   If a prior authorization is required to get your medication covered by your insurance company, please allow us  1-2 business days to complete this process.  Drug prices often vary depending on where the prescription is filled and some pharmacies may offer cheaper prices.  The website www.goodrx.com contains coupons for medications through different pharmacies. The prices here do not account for what the cost may be with help from insurance (it may be cheaper with your insurance), but the website can give you the price if you did not use any insurance.  - You can print the associated coupon and take it with your prescription to the pharmacy.  - You may also stop by our office during regular business hours and pick up a GoodRx coupon card.  - If you need  your prescription sent electronically to a different pharmacy, notify our office through [facility] or by phone at [phone] option 4.     Si Usted Necesita Algo Despus de Su Visita  Tambin puede enviarnos un mensaje a travs de Clinical cytogeneticist. Por lo general respondemos a los mensajes de MyChart en el transcurso de 1 a 2 das hbiles.  Para renovar recetas, por favor pida a su farmacia que se ponga en contacto con nuestra oficina. Randi lakes de fax es [place] [phone].  Si tiene un asunto urgente cuando la clnica est cerrada y que no puede esperar hasta el siguiente da hbil, puede llamar/localizar a su doctor(a) al nmero que aparece a continuacin.   Por favor, tenga en cuenta que aunque hacemos todo lo posible para estar disponibles para asuntos urgentes fuera del horario de [place], no estamos disponibles las 24 horas del da, los 7 [address]  [address] de la [place].   Si tiene un problema urgente y no puede comunicarse con nosotros, puede optar por buscar atencin mdica  en el consultorio de su doctor(a), en una clnica privada, en un centro de atencin urgente o en una sala de emergencias.  Si tiene Engineer, drilling, por favor llame inmediatamente al 911 o vaya a la sala de emergencias.  Nmeros de bper  - Dr. Hester: [phone]  - Dra. Jackquline: [phone]  - Dr. Claudene: [phone]   En caso de inclemencias del tiempo, por favor llame a landry capes principal al [phone] para una actualizacin sobre el [place] de cualquier retraso o cierre.  Consejos para la medicacin en dermatologa: Por favor, guarde las cajas en las que vienen los medicamentos de uso tpico para ayudarle a seguir las instrucciones sobre dnde y cmo usarlos. Las farmacias generalmente imprimen las instrucciones del medicamento slo en las cajas y no directamente en los tubos del [place].   Si su medicamento es muy caro, por favor, pngase en contacto con landry rieger llamando al  [phone] y presione la opcin 4 o envenos un mensaje a travs de Clinical cytogeneticist.   No podemos decirle cul ser su copago por los medicamentos por adelantado ya que esto es diferente dependiendo de la cobertura de su seguro. Sin embargo, es posible que podamos encontrar un medicamento sustituto a Audiological scientist un formulario para que  el seguro cubra el medicamento que se considera necesario.   Si se requiere una autorizacin previa para que su compaa de seguros malta su medicamento, por favor permtanos de 1 a 2 das hbiles para completar este proceso.  Los precios de los medicamentos varan con frecuencia dependiendo del Environmental consultant de dnde se surte la receta y alguna farmacias pueden ofrecer precios ms baratos.  El sitio web www.goodrx.com tiene cupones para medicamentos de Health and safety inspector. Los precios aqu no tienen en cuenta lo que podra costar con la ayuda del seguro (puede ser ms barato con su seguro), pero el sitio web puede darle el precio si no utiliz Tourist information centre manager.  - Puede imprimir el cupn correspondiente y llevarlo con su receta a la farmacia.  - Tambin puede pasar por nuestra oficina durante el horario de atencin regular y Education officer, museum una tarjeta de cupones de GoodRx.  - Si necesita que su receta se enve electrnicamente a una farmacia diferente, informe a nuestra oficina a travs de MyChart de [place] o por telfono llamando al [phone] y presione la opcin 4.

## 2025-02-05 ENCOUNTER — Ambulatory Visit: Admitting: Dermatology
